# Patient Record
Sex: Male | Born: 1955 | Race: White | Hispanic: No | Marital: Married | Smoking: Current every day smoker
Health system: Southern US, Community
[De-identification: ages and names within clinical notes are randomized; demographics above are authoritative.]

## PROBLEM LIST (undated history)

## (undated) DIAGNOSIS — K219 Gastro-esophageal reflux disease without esophagitis: Secondary | ICD-10-CM

## (undated) DIAGNOSIS — M1990 Unspecified osteoarthritis, unspecified site: Secondary | ICD-10-CM

## (undated) DIAGNOSIS — I10 Essential (primary) hypertension: Secondary | ICD-10-CM

## (undated) DIAGNOSIS — N400 Enlarged prostate without lower urinary tract symptoms: Secondary | ICD-10-CM

## (undated) HISTORY — PX: CATARACT EXTRACTION, BILATERAL: SHX1313

## (undated) HISTORY — DX: Essential (primary) hypertension: I10

---

## 2010-01-20 ENCOUNTER — Inpatient Hospital Stay
Admission: EM | Admit: 2010-01-20 | Disposition: A | Discharge status: Home / Self Care | Payer: Self-pay | Source: Emergency Department | Admitting: Surgery

## 2010-02-04 ENCOUNTER — Ambulatory Visit (INDEPENDENT_AMBULATORY_CARE_PROVIDER_SITE_OTHER)
Admit: 2010-02-04 | Disposition: A | Discharge status: Home / Self Care | Payer: Self-pay | Source: Ambulatory Visit | Admitting: Surgical Critical Care

## 2010-03-18 ENCOUNTER — Ambulatory Visit (INDEPENDENT_AMBULATORY_CARE_PROVIDER_SITE_OTHER): Admit: 2010-03-18 | Disposition: A | Discharge status: Home / Self Care | Payer: Self-pay | Source: Ambulatory Visit

## 2010-05-13 ENCOUNTER — Emergency Department: Admit: 2010-05-13 | Payer: Self-pay | Source: Emergency Department | Admitting: Emergency Medicine

## 2011-04-11 NOTE — H&P (Unsigned)
Account Number: [id number]    *** PRELIMINARY DRAFT ***      Document ID: [id number]      Admit Date: 01/20/2010            Patient Location: [facility]11-01      Patient Type: I            ATTENDING PHYSICIAN: Martha Clan, MD                  HISTORY OF PRESENT ILLNESS:      The patient is a 55 year old man who reportedly fell approximately 20 feet      off of a ladder yesterday at 11:00 a.m.  He was later seen at [facility] where he was evaluated and noted to have multiple      right-sided rib fractures as well as a transverse process fractures of      right L1 and L2 and was recommended to transfer to [facility]      for further evaluation.  The patient initially refused and left [facility] AMA.  He now presents to [facility] for      further evaluation and definitive care.  Patient is currently complaining      of right posterior thoracic pain.  He is otherwise awake and alert.  He      reports a loss of consciousness at the scene.            PAST MEDICAL HISTORY:      None.            PAST SURGICAL HISTORY:      None.            MEDICATIONS:      None.            SOCIAL HISTORY:      A 50-pack-year smoking history.  He drinks 2 to 3 alcoholic beverages daily      and more on the weekends.            ALLERGIES:      He has no known drug allergies.            SOCIAL HISTORY:      Lives with his wife.  He works cutting trees.            REVIEW OF SYSTEMS:      A complete review of systems was performed including constitutional, skin,      neurologic, eye, ear, nose, throat, cardiovascular, pulmonary,      gastrointestinal, genitourinary, musculoskeletal, and skin.  These were all      negative except as noted above.            FAMILY HISTORY:      Reviewed and noncontributory.            PHYSICAL EXAMINATION:      VITAL SIGNS:  Blood pressure is 132/63, heart rate is 85, temperature is      96.2, oxygen saturations are 96% on room air.      GENERAL:  He  is well developed, well nourished, in no acute distress.      HEENT:  Normocephalic, atraumatic.  Pupils are equal, round, reactive to      light.  TMs are clear bilaterally.  Nares and oropharynx are normal with      moist mucous membranes.      NECK:  He  has no cervical spine tenderness or stepoffs.      CHEST:  Lungs are clear to auscultation bilaterally with symmetric rise and      fall of the chest.  He has tenderness to palpation over the right posterior      thorax with no crepitus.      CARDIOVASCULAR:  Regular rate and rhythm with 2+ radial and dorsalis pedis      pulses bilaterally.      ABDOMEN:  Soft, nontender, nondistended.  Pelvis is stable to compression.      BACK:  Without thoracic or lumbar spine tenderness.      RECTAL:  Shows normal tone, no gross blood, and a normal prostate.      GENITALIA:  Normal male genitalia with no blood at the urethral meatus.      EXTREMITIES:  He has full active range of motion bilateral upper and lower      extremities with no gross deformities of bilateral upper and lower      extremities.      NEUROLOGIC:  GCS is 15 with no focal deficits.            RADIOGRAPHIC EVALUATION:      A chest x-ray, CT of the head, CT of the cervical spine, CT of the chest,      abdomen and pelvis from [facility], the images were reviewed      on disk.  The findings are mildly displaced transverse process fractures on      the right of L1 and L2 as well as right posterior rib fractures of right      ribs 4, 5, 6, 7, and 11 with no pneumothorax.            IMPRESSION AND PLAN:      The patient has a concussion, 5 posterior rib fractures on the right, a      transverse process fracture on the right of L1 and L2.  Has a history of      alcohol abuse.  He will be admitted to the [facility] for aggressive pulmonary      toilet and analgesia as he is at a risk of significant respiratory      compromise with multiple right-sided rib fractures.  He will be placed on      the [facility] Withdrawal Assessment protocol.  He will receive a      regular diet.                        Electronic Signing Provider      _______________________________     Date/Time Signed: _____________      Martha Clan MD ([id number])            D:  01/20/2010 02:43 AM by Dr. Martha Clan, MD ([id number])      T:  01/20/2010 06:24 AM by [id number]          Everlean Cherry: [id number]) (Doc ID: [id number])                  cc:

## 2011-04-11 NOTE — Progress Notes (Unsigned)
Account Number: [id number]    *** PRELIMINARY DRAFT ***      Document ID: [id number]      Admit Date: 02/04/2010      Service Date: 02/04/2010            Patient Location: DISCHARGED 02/05/2010      Patient Type: O            PHYSICIAN/PROVIDER: Particia Lather MD                  CHIEF COMPLAINT:      Follow up for rib fractures.            HISTORY OF PRESENT ILLNESS:      This is a 55 year old Caucasian male status post a fall off a ladder 20      feet above ground on July 30.  He was admitted as a transfer from [facility] on July 31 for multiple right rib fractures, L1-L2      transverse process fractures, and a concussion.  He did well with pain      control and was discharged the following day on August 1 with a lumbar      corset.  Today, the patient complains of pain in his right side and back.      The pain has been somewhat improved, but the Percocet appears not to be      working, and especially the patient complains of difficulty sleeping at      night secondary to pain.  Other than that, the patient has no respiratory      symptoms and is able to ambulate and climb stairs without shortness of      breath.  He is eating well and moving his bowels regularly.  He has not yet      been to see Dr. Jaynie Collins, his initial neurosurgical consultant.            PHYSICAL EXAMINATION:      VITAL SIGNS:  Weight 156 pounds, pulse 86, blood pressure 147/69.      GENERAL:  The patient is a middle-aged, thin male, alert, and well      appearing.      LUNGS:  Clear to auscultation bilaterally.      HEART:  Regular rate and rhythm.      CHEST:  Wall tender to palpation on the right lateral and posterior chest      wall.      ABDOMEN:  Soft, nontender, nondistended.      BACK:  Tender from the lower thoracic down to the mid lumbar region      extending to the right.            ASSESSMENT AND PLAN:      A 55 year old male status post fall with multiple right rib fractures and      L1-L2 transverse process fractures.   Overall, he is improved and doing well      from a respiratory standpoint.  He reports he was actually taking Vicodin      instead of Percocet, even though initially he was discharged with Percocet.       He was given a prescription for Vicodin #40.  Additionally, he was      instructed to take 800 mg of ibuprofen t.i.d. with meals until his pain is      improved.  For his insomnia,  he was given a short course of  Flexeril as      needed, also p.r.n. muscle spasm.  The patient was advised that if he      continues to have pain that is not improved in his chest wall, he may      return to the trauma clinic, otherwise, he is discharged.  For his back, he      was advised to see Dr. Jaynie Collins if his pain persists.            This patient was discussed with Dr. Estil Daft who agrees with the above.                        Electronic Signing Provider      _______________________________     Date/Time Signed: _____________      Particia Lather MD ([id number])            D:  02/04/2010 15:53 PM by Dr. Lowanda Foster, MD ([id number])      T:  02/05/2010 10:19 AM by [id number]          Everlean Cherry: [id number]) (Doc ID: [id number])                  cc:

## 2011-04-11 NOTE — Progress Notes (Signed)
Stanley Kim, Stanley Kim      MRN:          [id number]      Account:      [id number]      Document ID:  [id number]      Service Date: 03/18/2010            Admit Date: 03/18/2010            Patient Location: DISCHARGED 03/19/2010      Patient Type: O            PHYSICIAN/PROVIDER: Jed Limerick MD                  DATE OF BIRTH:      11-25-1955.            HISTORY OF PRESENT ILLNESS:      This is a 55 year old man who fell approximately 20 feet off a ladder on      January 20, 2010.  The patient was seen the emergency room and admitted with      concussion and posterior rib fractures on the right and transverse process      fractures of L1 and L2.  He was previously seen on August 15 in our clinic      and was given a prescription for Vicodin at that time, 40 tablets and was      instructed to take 800 mg of ibuprofen t.i.d. with meals until his pain      improved.  The patient was given a short course of Flexeril also p.r.n. for      muscle spasms.  The patient was advised to see Dr. Jaynie Collins if his pain      persisted.  Upon arrival to the office today, this patient stated that Dr.      Duanne Moron office said they would not see him because he owed them money and due      to his financial difficulty he was unable to pay them and there is a      lawsuit ongoing.  I called Dr. Duanne Moron office and spoke with the billing      department and they stated that they could not see him because the      patient's wife called on September 16 to make an appointment and their      office policy is that there is a $250.00 fee for self-pay patients.  When      the patient's wife was told about this, she yelled at them and swore at      them and the patient said that they are going to sue the doctor's office.      Today, the patient presents to our office complaining of neck pain.  Upon his      fall, the patient did not complain of any neck pain and we had no indication      of any neck injury from his fall in July.  I did order a C spine x-ray  today      and that was negative for fracture, showing only degenerative changes.            PHYSICAL EXAMINATION:      VITAL SIGNS:  Temperature is 98.9, blood pressure is 156/82, pulse is 117,      weight is 175 pounds, respiratory rate is 16.      NEUROLOGIC:  The patient is alert and oriented x3.  GCS of 15.  HEART:  The patient's vital signs are stable.  His blood pressure of 156 is      slightly hypertensive.  His pulse is 117 which is tachycardic at this time.       The patient is asymptomatic in that regard.      LUNGS:  The patient is breathing well and independently.  Clear to      auscultation bilaterally.  No wheezes, rales, or rhonchi.                                   Page 1 of 2      Stanley Kim, Stanley Kim      MRN:          [id number]      Account:      [id number]      Document ID:  [id number]      Service Date: 03/18/2010            NECK:  Nontender to palpation with no stepoffs or deformities felt.            PLAN:      From a neurological standpoint, the patient should follow up with Dr. Duanne Moron      office.  We did contact Dr. Duanne Moron office after seeing the patient and did      tell them that he was going to be calling them for a followup appointment      regarding his pain.   We have recommended Aleve for pain, 440 mg b.i.d.      over-the-counter and following up with Dr. Duanne Moron office.  The patient has      been told that he could contact our office if there are any further issues      that we can assist with but at this time he has no need to follow up with      trauma specifically unless he has ongoing issues.            This patient was seen with Dr. Mauricio Po.            If there are questions you can contact Payton Spark, physician assistant      with the trauma service, extension [phone].                        Electronic Signing Provider            D:  03/18/2010 16:37 PM by Ms. Cecilio Asper, PA ([id number])      T:  03/19/2010 16:44 PM by [id number]                  cc:                                    Page 2 of 2      Authenticated and Edited by Cecilio Asper, PA On 03/22/10 11:08:25 AM      Authenticated by Jed Limerick [phone]) On 05/02/2010 10:55:01 AM

## 2011-06-24 HISTORY — PX: CATARACT EXTRACTION W/ INTRAOCULAR LENS  IMPLANT, BILATERAL: SHX1307

## 2013-03-21 ENCOUNTER — Encounter (INDEPENDENT_AMBULATORY_CARE_PROVIDER_SITE_OTHER): Payer: Self-pay

## 2017-05-26 ENCOUNTER — Other Ambulatory Visit: Payer: Self-pay

## 2017-05-26 ENCOUNTER — Inpatient Hospital Stay
Admission: EM | Admit: 2017-05-26 | Discharge: 2017-05-30 | DRG: 964 | Disposition: A | Discharge status: Home Health | Payer: Medicare Other | Attending: Surgery | Admitting: Surgery

## 2017-05-26 ENCOUNTER — Inpatient Hospital Stay: Payer: Medicare Other

## 2017-05-26 DIAGNOSIS — G8911 Acute pain due to trauma: Secondary | ICD-10-CM | POA: Diagnosis present

## 2017-05-26 DIAGNOSIS — S32484A Nondisplaced dome fracture of right acetabulum, initial encounter for closed fracture: Secondary | ICD-10-CM | POA: Diagnosis present

## 2017-05-26 DIAGNOSIS — R0902 Hypoxemia: Secondary | ICD-10-CM | POA: Diagnosis present

## 2017-05-26 DIAGNOSIS — I10 Essential (primary) hypertension: Secondary | ICD-10-CM | POA: Diagnosis present

## 2017-05-26 DIAGNOSIS — S32391A Other fracture of right ilium, initial encounter for closed fracture: Secondary | ICD-10-CM | POA: Diagnosis present

## 2017-05-26 DIAGNOSIS — M5414 Radiculopathy, thoracic region: Secondary | ICD-10-CM | POA: Diagnosis present

## 2017-05-26 DIAGNOSIS — M25562 Pain in left knee: Secondary | ICD-10-CM | POA: Diagnosis present

## 2017-05-26 DIAGNOSIS — M25561 Pain in right knee: Secondary | ICD-10-CM | POA: Diagnosis present

## 2017-05-26 DIAGNOSIS — W109XXA Fall (on) (from) unspecified stairs and steps, initial encounter: Secondary | ICD-10-CM | POA: Diagnosis present

## 2017-05-26 DIAGNOSIS — S271XXA Traumatic hemothorax, initial encounter: Secondary | ICD-10-CM | POA: Diagnosis present

## 2017-05-26 DIAGNOSIS — J90 Pleural effusion, not elsewhere classified: Secondary | ICD-10-CM | POA: Diagnosis present

## 2017-05-26 DIAGNOSIS — S42021A Displaced fracture of shaft of right clavicle, initial encounter for closed fracture: Secondary | ICD-10-CM | POA: Diagnosis present

## 2017-05-26 DIAGNOSIS — J984 Other disorders of lung: Secondary | ICD-10-CM | POA: Diagnosis not present

## 2017-05-26 DIAGNOSIS — S32301A Unspecified fracture of right ilium, initial encounter for closed fracture: Secondary | ICD-10-CM

## 2017-05-26 DIAGNOSIS — S2241XA Multiple fractures of ribs, right side, initial encounter for closed fracture: Principal | ICD-10-CM | POA: Diagnosis present

## 2017-05-26 DIAGNOSIS — F17210 Nicotine dependence, cigarettes, uncomplicated: Secondary | ICD-10-CM | POA: Diagnosis present

## 2017-05-26 DIAGNOSIS — S42001A Fracture of unspecified part of right clavicle, initial encounter for closed fracture: Secondary | ICD-10-CM

## 2017-05-26 HISTORY — DX: Acute pain due to trauma: G89.11

## 2017-05-26 LAB — BASIC METABOLIC PANEL
BUN: 9 mg/dL (ref 9.0–28.0)
CO2: 24 mEq/L (ref 21–29)
Calcium: 8.3 mg/dL — ABNORMAL LOW (ref 8.5–10.5)
Chloride: 97 mEq/L — ABNORMAL LOW (ref 100–111)
Creatinine: 0.7 mg/dL (ref 0.5–1.5)
Glucose: 133 mg/dL — ABNORMAL HIGH (ref 70–100)
Potassium: 4.3 mEq/L (ref 3.5–5.1)
Sodium: 128 mEq/L — ABNORMAL LOW (ref 136–145)

## 2017-05-26 LAB — HEMOLYSIS INDEX: Hemolysis Index: 23 — ABNORMAL HIGH (ref 0–18)

## 2017-05-26 LAB — GFR: EGFR: >60

## 2017-05-26 MED ORDER — SODIUM CHLORIDE 0.9 % IV SOLN
INTRAVENOUS | Status: DC
Start: 2017-05-26 — End: 2017-05-26

## 2017-05-26 MED ORDER — LIDOCAINE 5 % EX PTCH
1.0000 | MEDICATED_PATCH | CUTANEOUS | Status: DC
Start: 2017-05-26 — End: 2017-05-30
  Administered 2017-05-26 – 2017-05-30 (×5): 1 via TRANSDERMAL
  Filled 2017-05-26 (×5): qty 1

## 2017-05-26 MED ORDER — SODIUM CHLORIDE 0.9 % IV SOLN
INTRAVENOUS | Status: DC
Start: 2017-05-26 — End: 2017-05-27

## 2017-05-26 MED ORDER — ENOXAPARIN SODIUM 30 MG/0.3ML SC SOLN
30.0000 mg | Freq: Once | SUBCUTANEOUS | Status: AC
Start: 2017-05-26 — End: 2017-05-26
  Administered 2017-05-26: 16:00:00 30 mg via SUBCUTANEOUS
  Filled 2017-05-26: qty 0.3

## 2017-05-26 MED ORDER — SENNOSIDES-DOCUSATE SODIUM 8.6-50 MG PO TABS
1.0000 | ORAL_TABLET | Freq: Every evening | ORAL | Status: DC
Start: 2017-05-26 — End: 2017-05-30
  Administered 2017-05-26 – 2017-05-29 (×4): 1 via ORAL
  Filled 2017-05-26 (×4): qty 1

## 2017-05-26 MED ORDER — AMLODIPINE BESYLATE 5 MG PO TABS
5.0000 mg | ORAL_TABLET | Freq: Every day | ORAL | Status: DC
Start: 2017-05-26 — End: 2017-05-30
  Administered 2017-05-26 – 2017-05-30 (×5): 5 mg via ORAL
  Filled 2017-05-26 (×5): qty 1

## 2017-05-26 MED ORDER — NALOXONE HCL 0.4 MG/ML IJ SOLN (WRAP)
0.2000 mg | INTRAMUSCULAR | Status: DC | PRN
Start: 2017-05-26 — End: 2017-05-30

## 2017-05-26 MED ORDER — CYCLOBENZAPRINE HCL 10 MG PO TABS
5.0000 mg | ORAL_TABLET | Freq: Three times a day (TID) | ORAL | Status: DC | PRN
Start: 2017-05-26 — End: 2017-05-30
  Administered 2017-05-26 – 2017-05-29 (×6): 5 mg via ORAL
  Filled 2017-05-26 (×6): qty 1

## 2017-05-26 MED ORDER — MORPHINE SULFATE 4 MG/ML IJ/IV SOLN (WRAP)
4.0000 mg | Freq: Once | Status: AC
Start: 2017-05-26 — End: 2017-05-26
  Administered 2017-05-26: 4 mg via INTRAVENOUS
  Filled 2017-05-26: qty 1

## 2017-05-26 MED ORDER — GABAPENTIN 100 MG PO CAPS
100.0000 mg | ORAL_CAPSULE | Freq: Three times a day (TID) | ORAL | Status: DC
Start: 2017-05-26 — End: 2017-05-27
  Administered 2017-05-26 (×3): 100 mg via ORAL
  Filled 2017-05-26 (×3): qty 1

## 2017-05-26 MED ORDER — ENOXAPARIN SODIUM 30 MG/0.3ML SC SOLN
30.0000 mg | Freq: Two times a day (BID) | SUBCUTANEOUS | Status: DC
Start: 2017-05-26 — End: 2017-05-27
  Administered 2017-05-27: 30 mg via SUBCUTANEOUS
  Filled 2017-05-26: qty 0.3

## 2017-05-26 MED ORDER — ACETAMINOPHEN 500 MG PO TABS
1000.0000 mg | ORAL_TABLET | Freq: Three times a day (TID) | ORAL | Status: DC
Start: 2017-05-26 — End: 2017-05-30
  Administered 2017-05-26 – 2017-05-30 (×14): 1000 mg via ORAL
  Filled 2017-05-26 (×14): qty 2

## 2017-05-26 MED ORDER — OXYCODONE HCL 5 MG PO TABS
5.0000 mg | ORAL_TABLET | ORAL | Status: DC | PRN
Start: 2017-05-26 — End: 2017-05-30
  Administered 2017-05-26 – 2017-05-29 (×8): 5 mg via ORAL
  Filled 2017-05-26 (×9): qty 1

## 2017-05-26 NOTE — H&P (Signed)
TRAUMA HISTORY AND PHYSICAL     Date Time: 05/26/17 5:46 AM  Patient Name: Stanley Kim  Attending Physician: Martha Clan, MD  Primary Care Physician: Alinda Dooms, MD    Date of Admission:   05/26/2017  3:55 AM    Trauma Level:   Consult    Assessment/Plan:   The patient has the following active problems:  Patient Active Problem List   Diagnosis   . Closed fracture of six ribs of right side   . Closed nondisplaced dome fracture of right acetabulum   . Acute pain due to trauma   . Closed displaced fracture of shaft of right clavicle     61yoM s/p mechanical fall down stairs with R clavicle fx, R 1, 4-8 acute rib fx (+additional rib fx in various stages of healing), R acetabular fx.    Plan by systems:  Neuro: Pain control with tylenol, gabapentin, flexeril, oxy prn  Pulm: pulm toilet, IS  CV: cont home amlodipine, hold losartan inpatient  Endo: NAI  GI: bowel regimen  Heme/ID: [id number] BID DVT prophy  Renal: NAI  Neuromuscular: ortho consult for clavicle/acetabular fx  Psych: NAI  Wounds: NAI    Patient will be admitted to: [facility]  Massive transfusion protocol:  No      Consulting Services:   Orthopedics - [place]    Patient Complaint:   Stanley Kim is a 61 y.o. male who presents to the hospital after Fall: Fall from # of stairs: several.      Scene Report:      Scene GCS: Eye opening 4 - spontaneous, Verbal Response 5 - alert/oriented, Motor Response 6 - obeys commands. Total GCS: 15   Transport: ALS, Time of Injury 5 hours ago   Transferred from: [facility]   LOC: No     Intubated: No   Hemodynamically: Stable   C-spine immobilized pre-hospital: No          The medications, past medical/surgical history, family history, allergies & full review of systems were:  Reviewed    Allergies:   No Known Allergies    Medication:     Prescriptions Prior to Admission   Medication Sig   . amLODIPine (NORVASC) 5 MG tablet Take 5 mg by mouth daily.   Marland Kitchen losartan (COZAAR) 50 MG tablet  Take 50 mg by mouth daily.       Past Medical History:     Past Medical History:   Diagnosis Date   . Hypertension        Past Surgical History:     Past Surgical History:   Procedure Laterality Date   . CATARACT EXTRACTION, BILATERAL         Family History:   Cancer- father    Social History:     Social History     Social History   . Marital status: Married     Spouse name: N/A   . Number of children: N/A   . Years of education: N/A     Social History Main Topics   . Smoking status: Current Every Day Smoker   . Smokeless tobacco: Current User   . Alcohol use Yes   . Drug use: No   . Sexual activity: Yes     Other Topics Concern   . Not on file     Social History Narrative   . No narrative on file       Vaccination:   Tetanus up to date: Unknown  Review of Systems:   Review of Systems   Constitutional: Negative for fever.   Respiratory: Negative for shortness of breath.    Cardiovascular: Positive for chest pain.   Gastrointestinal: Negative for abdominal pain.   Genitourinary: Negative for dysuria.   Musculoskeletal: Positive for joint pain.   Neurological: Negative for speech change, focal weakness, loss of consciousness and headaches.   All other systems reviewed and are negative.      Physical Exam:   Physical Exam   Constitutional: He is oriented to person, place, and time and well-developed, well-nourished, and in no distress.   HENT:   Head: Normocephalic and atraumatic.   Eyes: Pupils are equal, round, and reactive to light.   Neck: Normal range of motion.   No midline TTP   Cardiovascular: Normal rate and regular rhythm.    Pulmonary/Chest: Effort normal. No respiratory distress.   Ecchymosis over R clavicle   Abdominal: Soft. There is no tenderness.   Musculoskeletal: Normal range of motion.   Neurological: He is alert and oriented to person, place, and time. GCS score is 15.       Vitals:    05/26/17 0359   BP: 147/71   Pulse: 71   Resp: 22   Temp: 98.4 F (36.9 C)   SpO2: 97%   Temp:  [97.1 F (36.2  C)-98.4 F (36.9 C)] 97.5 F (36.4 C)  Heart Rate:  [71-75] 75  Resp Rate:  [18-22] 18  BP: (136-156)/(71-76) 139/76      Labs:     Results     ** No results found for the last 24 hours. **          Rads:   Radiological Procedure reviewed.      The following images were received from an outside facility and reviewed:CT Head, CT Spine, CT Chest, CT Abdomen & Pelvis, Thoracic Spine and Lumbar Spine    Attending Attestation:   I saw and examined the patient at 0450h. I was present for Dr Tilman Neat evaluation. My examination was done concurrently with the resident. I have personally reviewed the patient's history and events, along with vitals, labs, radiology images and additional findings found in detail within the note above. I have personally edited and added to the text of this note to reflect my exam and medical assessment and plan.  I concur with the above documented assessment and care plan which was developed with and reviewed by me, with additions and exceptions noted by me.     Requires inpatient admission for multiple rib fx; I anticipate> 2 midnight stay    Larwance Sachs, MD, [facility]  Acute Care Surgery

## 2017-05-26 NOTE — EDIE (Signed)
[name]?NOTIFICATION?05/26/2017 03:53?Kim, Stanley E?MRN: [id number]    This patient has registered at the [facility] Emergency Department   For more information visit: [email or web address]   Criteria met      5 ED Visits in 12 Months    Security Events  No recent Security Events currently on file    ED Care Guidelines  There are currently no ED Care Guidelines in [name] for this patient. Please check your facility's medical records system.    Care Providers  Cherise Fedder has no care providers on record at this time.   E.D. Visit Count (12 mo.)  Facility Visits   [facility] 4   [facility] 1   Total 5   Note: Visits indicate total known visits.      Recent Emergency Department Visit Summary  Admit Date Facility [facility] Type Major Type Diagnoses or Chief Complaint   May 26, 2017 [place] [facility] [place] [place] [facility]  Emergency      Fall, Trauma -> Tr fr [facility]      May 25, 2017 [facility] [place] [place] [facility]  Emergency     May 25, 2017 [facility] [place] [place] [facility]  Emergency      Closed Head Injury With LOC      Fall      May 25, 2017 [facility] [place] [place] [facility]  Emergency     May 25, 2017 [facility] [place]  Emergency  Emergency         Recent Inpatient Visit Summary  No recorded inpatient visits.       Prescription Monitoring Program  000??- Narcotic Use Score  000??- Sedative Use Score  000??- Stimulant Use Score  - All Scores range from 000-999 with 75% of the population scoring < 200 and on 1% scoring above 650  - The last digit of the narcotic, sedative, and stimulant score indicates the number of active prescriptions of that type  - Higher Use scores correlate with increased prescribers, pharmacies, mg equiv, and overlapping prescriptions   Concerning or unexpectedly high scores should prompt a review  of the PMP record; this does not constitute checking PMP for prescribing purposes.    The above information is provided for the sole purpose of patient treatment. Use of this information beyond the terms of Data Sharing Memorandum of Understanding and License Agreement is prohibited. In certain cases not all visits may be represented. Consult the aforementioned facilities for additional information.   ? 2018 [employer] - [place], [place] - [email or web address]

## 2017-05-26 NOTE — Plan of Care (Signed)
Pt home meds given to the family.

## 2017-05-26 NOTE — UM Notes (Signed)
05/26/17 0458  Admit to Inpatient (ADULT INPATIENT ADMIT PANEL ) Once         UTILIZATION REVIEW CONTACT: Name: Josie Bransyn Adami  Clinical Case Manager  - Utilization Review  [facility]  Address:  [address] [place], [place]  [address]  NPI:   [id number]  Tax ID:  [id number]  Phone: [phone]  Fax: [phone]    Please use fax number [phone] to provide authorization for hospital services or to request additional information.        PATIENT NAME: Stanley Kim,Stanley Kim   DOB: [birth year]   PMH:  has a past medical history of Hypertension.  PSH:  has a past surgical history that includes Cataract extraction, bilateral.     ADMISSION REVIEW   61 y.o. year old male.  Orthopaedic consultation has specifically been requested to address this patient's current musculoskeletal presentation. Patient is a 61 year old male who presents after falling down stairs while using crutches. He states he was walking out of his house on his way to dinner when he lost his balance. Noticed immediate pain to right hip and right shoulder. Pain is localized to those areas and do not radiate anywhere. He states he immediately stood up and walked after the fall. He walked into the ambulance which brought him into the hospital. He states he has been using crutches recently due to left knee pain, he has plans for total knee replacement in the near future.    VS: 98.4, 71, 22, 147/71, 97% O2 2L NC    Diagnostics: RIGHT CLAVICLE XR-1. Oblique fracture of the midshaft of the right clavicle.  2. Partially imaged right rib fractures.    Medications:   oxyCODONE (ROXICODONE) immediate release tablet 5 mg 5 mg, PO, Q4H PRN Given: 12/04 5366     Assessment:  Right Upper Extremity:   Inspection:  swelling and ecchymosis to right clavicle  Palpation:  Tenderness-mild  ROM:  within normal limits  Joint Stability: normal  Strength: normal  Skin: normal              Peripheral Vascular: radial pulse present  Reflexes: normal  Sensation:  normal  Lymph Nodes: None Palpable  Coordination: Normal    Plan:   F/u XRay pelvis and clavicle   Imported CT reviewed   Pending further imaging likely WBAT right lower extremity,   Walker PRN  WBAT right upper extremity  Likely non-operative management    Josie Comfort Iversen, RN, BSN  UR Case Manager   [facility]  T [phone]

## 2017-05-26 NOTE — Plan of Care (Signed)
Pt Na[facility] [address] surgery dept was notified ([id number]) and will call us back with order,

## 2017-05-26 NOTE — PT Eval Note (Signed)
[facility]   Physical Therapy Evaluation   Patient: Stanley Kim    MRN#: [id number]   Unit: [facility] TOWER 8  Bed: V409/W119.14    Discharge Recommendations:   Discharge Recommendation: SNF   DME Recommendation: DME Recommended for Discharge:  (tbd at rehab)    If SNF recommended discharge disposition is not available, patient will need hands on assist for mobility, hospital bed, w/c, transport into home and HHPT.        Assessment:   Stanley Kim is a 61 y.o. male admitted 05/26/2017 s/p fall sustaining R rib and clavicle fracture as well as R acetabular fracture. Pt has been using crutches at home d/t L knee pain and is awaiting knee replacement.  Denies multiple falls at home.    Pt's functional mobility is impacted by:  decreased activity tolerance, decreased balance, decreased bed mobility, gait impairment, pain, decreased strength and transfers, poor insight .  There are a few comorbidities or other factors that affect plan of care and require modification of task including: assistive device needed for mobility, sensory and chronic pain.  Standardized tests and exams incorporated into evaluation include AMPAC mobility, ROM , Strength, pulse ox and pain.  Pt demonstrates a evolving clinical presentation due to severe pain.  Pt would continue to benefit from PT to address these deficits and increase functional independence.        Patient left without needs and call bell within reach. RN notified of session outcome.   Therapy Diagnosis: gait impairment    Rehabilitation Potential: good    Treatment Activities: eval performed  Educated the patient to role of physical therapy, plan of care, goals of therapy and safety with mobility and ADLs, energy conservation techniques, pursed lip breathing.    Plan:   Treatment/Interventions: Gait training, Functional transfer training, LE strengthening/ROM, Bed mobility, Compensatory technique education    PT Frequency:  3-4x/wk    Risks/Benefits/POC Discussed with Pt/Family: With patient/family       Precautions and Contraindications:  Falls  WBAT R UE/LE  Chronic L knee pain as well as both hip pain    Consult received for Fredna Dow for PT Evaluation and Treatment.  Patient's medical condition is appropriate for Physical therapy intervention at this time.    Medical Diagnosis: Closed fracture of multiple ribs of right side, initial encounter [S22.41XA]  Closed displaced fracture of right clavicle, unspecified part of clavicle, initial encounter [S42.001A]      History of Present Illness:   Stanley Kim is a 61 y.o. male admitted on 05/26/2017 with " s/p mechanical fall down stairs with R clavicle fx, R 1, 4-8 acute rib fx (+additional rib fx in various stages of healing), R acetabular fx." per H&P        Past Medical/Surgical History:   HTN    X-Rays/Tests/Labs:  XR Hips BI min 5 vw with pelvis   IMPRESSION:    Mildly displaced right iliac fracture, similar to the CT  last night. Severe hip arthritis, worse on the left. No pelvic canal  narrowing.     XR Clavicle Right  IMPRESSION:         1. Oblique fracture of the midshaft of the right clavicle.  2. Partially imaged right rib fractures.       Social History:   Prior Level of Function:  Baseline Activity Level:amb w/ crutches at home and uses RW in the community, indep w/ care  DME Currently at  Home:crutches, RW    Home Arrangement:  Living Arrangement:spouse  Type of Home:2 level but has first floor set up  Home Living Notes/Comments: goes through the back door and there are no steps    Subjective:   Patient is agreeable to participation in the therapy session. Family and/or guardian are agreeable to patient's participation in the therapy session. Nursing clears patient for therapy.     Pt Goal: return home  " I can try with crutches. I'll do better with crutches"    Pain:  Pain Assessment: Numeric Scale  Pain Score:8  Pain Location:R hips, ribs, clavicle  Pain  Intervention:premedicated    Objective:   Patient is in bed with peripheral IV access in place.    Vital Signs:  At rest 95% RA  W/ activity 94%RA    Musculoskeletal Examination:  B LE ZOX:WRUEAVW IR on B hips d/t pain to obtain optimal positioning for sit<>Stand    R LE Strength:gross 4-/5  L LE Strength:gross 4-/5    Sensory Assessment:  Intact    Functional Mobility:  Bed Mobility   Rolling:mod  Cues for seqeunce  Supine to Sit:mod of 2 cues for sequence  Sit to Supine:mod of 2  Sit to Stand:mod/max of 1 cues for LE positioning as pt has his legs are crossed d/t position of comfort  Stand to Sit: mod/max of 1    Ambulation:  PMP - Progressive Mobility Protocol   PMP Activity: Step 4 - Dangle at Bedside   Distance:1 step  Assist:mod of 2  Assistive Device:RW  Pattern:dec WB, heavy lean on RW, couldn't barely take a step d/t severe pain, requested to sit back down    Balance:  Standing Static:fair-  Standing Dynamic:fair-/poor    Participation:good  Endurance:fair    AM-PACT Inpatient Short Forms  Inpatient AM-PACT Performed? (PT): Basic Mobility Inpatient Short Form  AM-PACT "6 Clicks" Basic Mobility Inpatient Short Form  Turning Over in Bed: A lot  Sitting Down On/Standing From Armchair: A lot  Lying on Back to Sitting on Side of Bed: A lot  Assist Moving to/from Bed to Chair: A lot  Assist to Walk in Hospital Room: Total  Assist to Climb 3-5 Steps with Railing: Total  PT Basic Mobility Raw Score: 10  CMS 0-100% Score: 76.75%      Patient left with call bell within reach, all needs met,  SCD: off  Fall mat: in place  Bed alarm: on  Chair alarm: n/a  and all questions answered. RN notified of session outcome and patient response.     Goals:   Goals  Goal Formulation: With patient/family  Time for Goal Acheivement: 5 visits  Goals: Select goal  Pt Will Go Supine To Sit: with contact guard assist  Pt Will Perform Sit to Stand: with contact guard assist  Pt Will Transfer Bed/Chair: with rolling walker, with contact  guard assist  Pt Will Ambulate: 31-50 feet, with rolling walker, with contact guard assist     Matilde Sprang, PT   Pager ID# [phone] 05/26/2017, 4:56 PM    Time of treatment:   PT Received On: 05/26/17  Start Time: 1630  Stop Time: 1700  Time Calculation (min): 30 min

## 2017-05-26 NOTE — ED Notes (Signed)
Bed: S 16  Expected date:   Expected time:   Means of arrival:   Comments:  Jazion, [place]

## 2017-05-26 NOTE — Plan of Care (Deleted)
Problem: Pain  Goal: Pain at adequate level as identified by patient  Outcome: Progressing   05/26/17 2100   Goal/Interventions addressed this shift   Pain at adequate level as identified by patient Identify patient comfort function goal   Pt A&O X4. VSS.  Tolerated whole pills with thin liquids.  Pt unable to ambulate because of the pain,as well as reposition self in bed with some  assistance.   Encouraged TCDB and IS use.  Pt is expectorated copious amt of clear sputum.  Pt sounded very congested.    Continent of bowel and bladder. LBM 05/25/17.  Pt voided in urinal.  Good pedal pulses bilaterally,cap refill <3 sec in all 4 extremities.  Call bell and bedside table within reach at all times. Will continue to monitor patient closely.   Oxycodone 5mg  po was able to bring pain down to 5/10.  Will try to ATC oxycodone.    Cherly Beach  05/26/17  9:00 PM

## 2017-05-26 NOTE — Plan of Care (Signed)
Problem: Moderate/High Fall Risk Score >5  Goal: Patient will remain free of falls  Outcome: Progressing   05/26/17 0621   OTHER   High (Greater than 13) HIGH-Initiate use of floor mats as appropriate   Pt A&O X4.  BP is slightly elevated.  Pt said he had not taken his BP med today yet but he took it yesterday.  Tolerated whole pills with thin liquids.  Pt wanted to ambulate to BR with walker but there is no order for his activity restriction.  He can reposition self in bed with minimal assistance.   Encouraged TCDB and IS use.   Continent of  bladder. Pt voiding in urinal in ED.   Pain Controlled with morphine in ED.    Good pedal pulses bilaterally,cap refill <3 sec in all 4 extremities.   Call bell and bedside table within reach at all times. Will continue to monitor patient closely.     Cherly Beach  05/26/17  6:23 AM

## 2017-05-26 NOTE — Consults (Signed)
Orthopaedic Consult    Date Time: 05/26/17 4:34 PM  Patient Name: Stanley Kim, Stanley Kim ;   Lonia Blood* Attending Physician    Time first seen by orthopaedics: 8am        Assessment & Plan  Orthopaedic assessment:  61 y/o M with R pelvic fracture and R clavicular fracture    Reductions/Procedures/Splinting performed (indicate type of Anesthesia used):  none    Plan:    F/u XRay pelvis and clavicle   Imported CT reviewed   Pending further imaging likely WBAT right lower extremity,   Walker PRN  nwb right upper extremity  Likely non-operative management    Arlie Solomons, PGY-2  Dept of Orthopaedics   Personal Pager [phone]  Ortho on-call Pager [phone]    Attending Addendum/Attestation:  I have personally seen and examined this patient and have participated in their care. I agree with the clinical information, including the physical exam, radiological/x-ray interpretation. patient history, review of systems and plan as documented above. In addition, I have edited this note to reflect my findings and plan as well as to incorporate any new data.    Non op right clavicle anf right iliac wing pelvis fracture  WBAT R LE  NWB R UE    Lucky Rathke MD           HPI    Stanley Kim is a 61 y.o. year old male.  Orthopaedic consultation has specifically been requested to address this patient's current musculoskeletal presentation. Patient is a 61 year old male who presents after falling down stairs while using crutches. He states he was walking out of his house on his way to dinner when he lost his balance. Noticed immediate pain to right hip and right shoulder. Pain is localized to those areas and do not radiate anywhere. He states he immediately stood up and walked after the fall. He walked into the ambulance which brought him into the hospital. He states he has been using crutches recently due to left knee pain, he has plans for total knee replacement in the near future. Denies any loss of  consciousness during the fall. Denies any nausea, fever, vomiting, chest pain or shortness of breath.    Past Medical and Surgical History      Past Medical History:   Diagnosis Date   . Acute pain due to trauma 05/26/2017   . Hypertension         Past Surgical History:   Procedure Laterality Date   . CATARACT EXTRACTION, BILATERAL         Past Social History & Family History   Social History:   Social History     Social History   . Marital status: Married     Spouse name: N/A   . Number of children: N/A   . Years of education: N/A     Social History Main Topics   . Smoking status: Current Every Day Smoker   . Smokeless tobacco: Current User   . Alcohol use Yes   . Drug use: No   . Sexual activity: Yes     Other Topics Concern   . Not on file     Social History Narrative   . No narrative on file       Family History: No family history on file.    Relevant Family & Social  history reviewed.  Any findings relevant to current orthopaedic presentation noted in HPI, otherwise the remainder are noncontributory.  Review of Systems:   MSK noted as above.   GI: No current Nausea/vomiting  ENT: Denies sore throat, epistaxis  CV: Denies chest pain   Resp: No current shortness of breath    Other than mentioned above, there are no Constitutional, Neurological, Psychiatric, ENT, Ophthalmological, Cardiovascular, Respiratory, GI, GU, Musculoskeletal, Integumentary, Lymphatic, Endocrine or Allergic issues.            Home Medications     Prior to Admission medications    Medication Sig Start Date End Date Taking? Authorizing Provider   amLODIPine (NORVASC) 5 MG tablet Take 5 mg by mouth daily.   Yes [provider]   losartan (COZAAR) 50 MG tablet Take 50 mg by mouth daily.   Yes [provider]       Allergies   No Known Allergies    Radiology Studies: (actual Orthopaedically relevant films reviewed and read by Orthopaedics)   R pelvic fracture  R clavicle fracture                           Physical Exam:      Patient is a 61 y.o. year old male who is alert, well appearing, and in no distress and oriented to person, place, and time, mood is alert  Orientation: Fully Oriented    BP 150/75   Pulse 84   Temp 98.4 F (36.9 C) (Oral)   Resp 18   Ht 1.702 m (5\' 7" )   Wt 81.6 kg (180 lb)   SpO2 95%   BMI 28.19 kg/m   81.6 kg (180 lb)   1.702 m (5\' 7" )    Gait: not assessed     Heart: RRR  Lungs: CTAB      Right Upper Extremity:   Inspection:  swelling and ecchymosis to right clavicle  Palpation:  Tenderness-mild  ROM:  within normal limits  Joint Stability: normal  Strength: normal  Skin: normal   Peripheral Vascular: radial pulse present  Reflexes: normal  Sensation: normal  Lymph Nodes: None Palpable  Coordination: Normal      Right Lower Extremity:     Inspection:  No swelling, erythema, deformity, atrophy or hypertrophy noted  Palpation:  Tenderness-none  ROM:  within normal limits  Joint Stability: normal  Strength: normal  Skin: normal   Peripheral Vascular: normal  Reflexes: normal  Sensation: normal  Lymph Nodes: None Palpable  Coordination: Normal     Left Upper Extremity:   Inspection:  No swelling, erythema, deformity, atrophy or hypertrophy noted  Palpation:  Tenderness-none  ROM:  within normal limits  Joint Stability: normal  Strength: normal  Skin: normal   Peripheral Vascular: normal  Reflexes: normal  Sensation: normal  Lymph Nodes: None Palpable  Coordination: Normal    Left Lower Extremity:   Inspection:  No swelling, erythema, deformity, atrophy or hypertrophy noted  Palpation:  Tenderness-none  ROM:  within normal limits  Joint Stability: normal  Strength: normal  Skin: normal   Peripheral Vascular: normal  Reflexes: normal  Sensation: normal  Lymph Nodes: None Palpable  Coordination: Normal     Pelvis:   Skin: normal  Palpation: Tenderness- none  Stability: normal             The review of the patient's medications does not in any way constitute an endorsement, by this clinician,  of their use,  dosage, indications, route, efficacy, interactions, or other clinical parameters.    This note  was generated within the EPIC EMR using Dragon medical speech recognition software and may contain inherent errors or omissions not intended by the user. Grammatical and punctuation errors, random word insertions, deletions, pronoun errors and incomplete sentences are occasional consequences of this technology due to software limitations. Not all errors are caught or corrected.  Although every attempt is made to root out erroneus and incomplete transcription, the note may still not fully represent the intent or opinion of the author. If there are questions or concerns about the content of this note or information contained within the body of this dictation they should be addressed directly with the author for clarification.

## 2017-05-26 NOTE — ED Provider Notes (Signed)
[facility] EMERGENCY DEPARTMENT H&P      Visit date: 05/26/2017      CLINICAL SUMMARY          Diagnosis:    .     Final diagnoses:   Closed fracture of multiple ribs of right side, initial encounter   Closed displaced fracture of right clavicle, unspecified part of clavicle, initial encounter         MDM Notes:      Admit to trauma for treatment.         Disposition:         Inpatient Admit      ED Disposition     ED Disposition Condition Date/Time Comment    Admit  Tue May 26, 2017  4:58 AM Admitting Physician: Stanley Kim [phone]   Diagnosis: Multiple rib fractures [478295]   Estimated Length of Stay: > or = to 2 midnights   Tentative Discharge Plan?: Home or Self Care [1]   Patient Class: Inpatient [101]                         CLINICAL INFORMATION        HPI:      Chief Complaint: Fall  .    Stanley Kim is a 61 y.o. male transfer from [place], with hx of HTN, who presents with R acetabular fracture, R clavicular fracture, and R rib fractures (1, 4-8) s/p fall tonight on crutches tonight. CT head at [facility] negative. No blood thinners.    History obtained from: Patient          ROS:      Positive and negative ROS elements as per HPI.  All other systems reviewed and negative.      Physical Exam:      Pulse 71  BP 147/71  Resp 22  SpO2 97 %  Temp 98.4 F (36.9 C)      Constitutional: Vital signs reviewed. Well appearing.  Head: Normocephalic, atraumatic  Eyes: No conjunctival injection. No discharge. PERRL, EOMI  ENT: Mucous membranes moist, clear oropharynx  Neck: Normal range of motion. Trachea midline. No midline tenderness.  Respiratory/Chest: clear to auscultation. No wheezes, rales or rhonchi. Good air movement.  No dyspnea or work of breathing. +ecchymosis over R clavicle  Cardiovascular: Regular rate and rhythm. No murmur. No rubs, murmurs, gallops  Abdomen: soft and nontender in all quadrants. No guarding or rebound. No masses or hepatosplenomegaly  Upper  Extremities:No edema. No cyanosis. No deformity.  Lower Extremities: No edema. No cyanosis. No deformity.  Neurological: Alert Normal and symmetric motor tone by observation. Speech normal. Memory Normal. No facial assymetry.  Skin: Warm and dry. No rash.  Psychiatric: Normal affect. Normal concentration.                PAST HISTORY        Primary Care Provider: Alinda Dooms, Kim        PMH/PSH:    .     Past Medical History:   Diagnosis Date   . Hypertension        He has a past surgical history that includes Cataract extraction, bilateral.          Social/Family History:      He has no tobacco, alcohol, and drug history on file.    No family history on file.      Listed Medications on Arrival:    .  Home Medications     Med List Status:  Complete Set By: Stanley Moloney, RN at 05/26/2017  4:04 AM                amLODIPine (NORVASC) 5 MG tablet     Take 5 mg by mouth daily.     losartan (COZAAR) 50 MG tablet     Take 50 mg by mouth daily.         Allergies: He has No Known Allergies.            VISIT INFORMATION        Clinical Course in the ED:            Medications Given in the ED:    .     ED Medication Orders     Start Ordered     Status Ordering Provider    05/26/17 0430 05/26/17 0429  morphine injection 4 mg  Once     Route: Intravenous  Ordered Dose: 4 mg     Last MAR action:  Given Stanley Kim            Procedures:      Procedures      Interpretations:        Differential Diagnosis considered by Kim (not completely inclusive): rib fractures, pneumothorax, hemothorax, flail chest, pulmonary contusion    Rhythm Strip Interpretation / Cardiac Monitor Analysis: Yes: 74    Pulse Ox Analysis: Yes: saturation: 97 %; Oxygen use: room air; Interpretation: Normal     All tests, tracings, EKGs, vital signs and radiological studies above where applicable were interpreted by me, Stanley Nurse Kim.              RESULTS        Lab Results:      Results     ** No results found for the last 24 hours. **               Radiology Results:      No orders to display               Scribe Attestation:      I was acting as a Neurosurgeon for Stanley Kim on Stanley Kim  Treatment Team: Scribe: Stanley Kim     I am the first provider for this patient and I personally performed the services documented. Treatment Team: Scribe: Stanley Kim is scribing for me on Stanley Kim. This note and the patient instructions accurately reflect work and decisions made by me.  Stanley Kim    *This note was generated by the Epic EMR system/ Dragon speech recognition and may contain inherent errors or omissions not intended by the user. Grammatical errors, random word insertions, deletions, pronoun errors and incomplete sentences are occasional consequences of this technology due to software limitations. Not all errors are caught or corrected. If there are questions or concerns about the content of this note or information contained within the body of this dictation they should be addressed directly with the author for clarification.Stanley Kim  05/28/17 [phone]

## 2017-05-26 NOTE — Plan of Care (Signed)
Problem: Moderate/High Fall Risk Score >5  Goal: Patient will remain free of falls  Outcome: Progressing  Bed mobility at this time, safety precaution maintained and continue monitoring.    Problem: Safety  Goal: Patient will be free from injury during hospitalization  Outcome: Progressing  Pt oriented times 4 stable and has no distress, safety precaution maintained and continue monitoring.  Goal: Patient will be free from infection during hospitalization  Outcome: Progressing  Will monitor for signs of infection.    Problem: Pain  Goal: Pain at adequate level as identified by patient  Outcome: Progressing  Pain meds given as needed    Problem: Side Effects from Pain Analgesia  Goal: Patient will experience minimal side effects of analgesic therapy  Outcome: Progressing  Will monitor for side effect of pain meds    Problem: Discharge Barriers  Goal: Patient will be discharged home or other facility with appropriate resources  Outcome: Progressing      Problem: Psychosocial and Spiritual Needs  Goal: Demonstrates ability to cope with hospitalization/illness  Outcome: Progressing

## 2017-05-26 NOTE — OT Eval Note (Signed)
[facility]   Occupational Therapy Evaluation     Patient: Stanley Kim    MRN#: [id number]   Unit: [facility] TOWER 8  Bed: J191/Y782.95                                     Discharge Recommendations:   Discharge Recommendation: SNF   DME Recommended for Discharge:  (see alternative D/C rec)    If SNF recommended discharge disposition is not available, patient will need hands on assist for all ADLs and functional mobility, manual w/c, transport into home, and HHOT.    Assessment:   Stanley Kim is a 61 y.o. male admitted 05/26/2017 s/p fall down stairs resulting in R clavicle fx, R 1,4-8 rib fxs, and R acetabular fx. Pt is WBAT to both RUE and RLE. At baseline, pt able to complete ADLs with independence but requiring use of crutches for functional mobility. Pt with B hips internally rotated with difficulty completing external rotation even with physical assist. Pt unable to externally rotate hips enough to place feet below knees therefore pt attempting to stand with feet crossed. Pt requiring Mod A for sit to stand and only able to take 1 step forward with RW. Pt requiring Max A for LB dressing at this time - educated on use of AE to increase independence. Pt continues to benefit from OT services to maximize independence and safety with ADLs and functional mobility.      Expanded chart review completed including review of labs, review of imaging, review of vitals, review of H&P and physician progress notes and review of consulting physician notes .  Pt's ability to complete ADLs and functional transfers is impaired due to the following deficits:  decreased activity tolerance, decreased balance, decreased bed mobility, decreased strength, transfers  and weight bearing restrictions.  Pt demonstrates performance deficits with grooming, bathing, dressing, toileting and functional mobility. There are a few comorbidities or other factors that affect plan of care and require modification of task  including: assistive device needed for mobility, frequent falls, has stairs to manage and home alone for a portion of the day.  Pt would continue to benefit from OT to address these deficits and increase functional independence.      Impairments: Assessment: decreased strength;balance deficits;decreased independence with ADLs;decreased independence with IADLs;decreased endurance/activity tolerance    Therapy Diagnosis: Decreased independence with ADLs    Rehabilitation Potential: Prognosis: Good     Treatment Activities: Evaluation, ADL re-training, Functional mobility/trasnfer training, Pt education    Educated the patient to role of occupational therapy, plan of care, goals of therapy and safety with mobility and ADLs, weight bearing precautions.    Plan:   OT Frequency Recommended: 3-4x/wk     Treatment Interventions: ADL retraining;Functional transfer training;UE strengthening/ROM;Endurance training;Patient/Family training;Equipment eval/education;Neuro muscular reeducation;Compensatory technique education     Risks/benefits/POC discussed with patient         Precautions and Contraindications:   Precautions  Other Precautions: Fall risk; WBAT RUE and RLE      Consult received for Fredna Dow for OT Evaluation and Treatment.  Patient's medical condition is appropriate for Occupational Therapy intervention at this time.    Admitting Diagnosis: Closed fracture of multiple ribs of right side, initial encounter [S22.41XA]  Closed displaced fracture of right clavicle, unspecified part of clavicle, initial encounter [S42.001A]      History of Present Illness:  Stanley Kim is a 61 y.o. male admitted on 05/26/2017 with "s/p mechanical fall down stairs with R clavicle fx, R 1, 4-8 acute rib fx (+additional rib fx in various stages of healing), R acetabular fx." -Per H&P    Past Medical/Surgical History:  Past Medical History:   Diagnosis Date   . Acute pain due to trauma 05/26/2017   . Hypertension      Past Surgical  History:   Procedure Laterality Date   . CATARACT EXTRACTION, BILATERAL       Imaging/Tests/Labs:  Xr Clavicle Right    Result Date: 05/26/2017   1. Oblique fracture of the midshaft of the right clavicle. 2. Partially imaged right rib fractures. [name], MD 05/26/2017 10:40 AM    Xr Hips Bi Min 5 Vw With Pelvis    Result Date: 05/26/2017   Mildly displaced right iliac fracture, similar to the CT last night. Severe hip arthritis, worse on the left. No pelvic canal narrowing. [name], MD 05/26/2017 11:55 AM    Social History:   Prior Level of Function:  Prior level of function: Independent with ADLs, Ambulates with assistive device (Simultaneous filing. User may not have seen previous data.)  Assistive Device: Crutches, Front wheel walker  Baseline Activity Level: Community ambulation  Driving: does not drive  DME Currently at Home: [employer] walker, Crutches (shower chair)    Home Living Arrangements:  Living Arrangements: Spouse/significant other  Type of Home: House  Home Layout: Two level, Performs ADL's on one level (13 STE from from, no STE if goes in back)  Bathroom Shower/Tub: Medical sales representative: Standard  DME Currently at Home: [employer] walker, Crutches (shower chair)      Subjective:   Subjective: "I can do it with thr crutches" Patient is agreeable to participation in the therapy session. Nursing clears patient for therapy.     Goal:  To return home    Pain Assessment  Pain Assessment: Numeric Scale (0-10)  Pain Score: 8-severe pain  Pain Location:  (R hip, ribs, and clavicle)  Pain Intervention(s): Repositioned (Premedicated)      Objective:        Observation of Patient/Vital Signs:  Patient is in bed with SCD's and peripheral IV in place.    Cognitive Status and Neuro Exam:  Cognition/Neuro Status  Arousal/Alertness: Appropriate responses to stimuli  Attention Span: Appears intact  Orientation Level: Oriented X4  Memory: Appears intact  Following Commands: independent  Safety  Awareness: independent  Insights: Fully aware of deficits  Problem Solving: Able to problem solve independently              Musculoskeletal Examination  Gross ROM  Right Upper Extremity ROM:  (Grossly WFl - limited at shoulder 2/2 fx)  Left Upper Extremity ROM: within functional limits  Right Lower Extremity ROM:  (limited ER)  Left Lower Extremity ROM:  (limited ER)    Gross Strength  Right Upper Extremity Strength:  (Not formally assessed - [facility])  Left Upper Extremity Strength: within functional limits  Right Lower Extremity Strength:  (Not formally assessed - WFL)  Left Lower Extremity Strength: within functional limits              Sensory/Oculomotor Examination  Sensory  Auditory: intact  Tactile - Light Touch: intact  Visual Acuity: intact  Visual Perceptual Skills: intact         Activities of Daily Living  Self-care and Home Management  Eating: Independent  Grooming: Stand by Assist (seated)  Bathing: Moderate Assist  UB Dressing: Stand by Assist  LB Dressing: Maximal Assist  Toileting: Minimal Assist    Functional Mobility:  Mobility and Transfers  Supine to Sit: Moderate Assist  Sit to Supine: Moderate Assist  Sit to Stand: Moderate Assist;Maximal Assist (with RW)  Functional Mobility/Ambulation: Moderate Assist (with RW - only able to take one step)     PMP Activity: Step 6 - Walks in Room (only able to take 1 step)     Balance  Balance  Static Sitting Balance: good  Dyanamic Sitting Balance: fair  Static Standing Balance: fair  Dynamic Standing Balance: poor    Participation and Activity Tolerance  Participation and Endurance  Participation Effort: good  Endurance:  (fair )    Patient left with call bell within reach, all needs met, SCDs off, fall mat in place, bed alarm on, chair alarm N/A and all questions answered. RN notified of session outcome and patient response.       Goals:  Time For Goal Achievement: 5 visits  ADL Goals  Patient will groom self: Modified Independent, at sinkside  Patient will  dress lower body: Modified Independent, with AE  Pt will complete bathing: Modified Independent  Patient will toilet: Modified Independent  Mobility and Transfer Goals  Pt will perform functional transfers: Modified Independent, with rolling walker  Other Goal:  (Will complete bed mobility with Mod I)  Neuro Re-Ed Goals  Pt will perform dynamic standing balance: Supervision, for 10 minutes, to complete standing ADLs safely                      Rosina Lowenstein, MS, OTR/L  Pager # [phone]         Time of treatment:   OT Received On: 05/26/17  Start Time: 1624  Stop Time: 1655  Time Calculation (min): 31 min

## 2017-05-27 ENCOUNTER — Inpatient Hospital Stay: Payer: Medicare Other

## 2017-05-27 ENCOUNTER — Other Ambulatory Visit: Payer: Medicare Other

## 2017-05-27 DIAGNOSIS — R0902 Hypoxemia: Secondary | ICD-10-CM

## 2017-05-27 LAB — CBC AND DIFFERENTIAL
Absolute NRBC: 0 10*3/uL
Basophils Absolute Automated: 0.04 10*3/uL (ref 0.00–0.20)
Basophils Automated: 0.5 %
Eosinophils Absolute Automated: 0.12 10*3/uL (ref 0.00–0.70)
Eosinophils Automated: 1.5 %
Hematocrit: 36 % — ABNORMAL LOW (ref 42.0–52.0)
Hgb: 11.4 g/dL — ABNORMAL LOW (ref 13.0–17.0)
Immature Granulocytes Absolute: 0.03 10*3/uL
Immature Granulocytes: 0.4 %
Lymphocytes Absolute Automated: 0.5 10*3/uL (ref 0.50–4.40)
Lymphocytes Automated: 6.3 %
MCH: 32 pg (ref 28.0–32.0)
MCHC: 31.7 g/dL — ABNORMAL LOW (ref 32.0–36.0)
MCV: 101.1 fL — ABNORMAL HIGH (ref 80.0–100.0)
MPV: 9.6 fL (ref 9.4–12.3)
Monocytes Absolute Automated: 0.49 10*3/uL (ref 0.00–1.20)
Monocytes: 6.2 %
Neutrophils Absolute: 6.71 10*3/uL (ref 1.80–8.10)
Neutrophils: 85.1 %
Nucleated RBC: 0 /100 WBC (ref 0.0–1.0)
Platelets: 175 10*3/uL (ref 140–400)
RBC: 3.56 10*6/uL — ABNORMAL LOW (ref 4.70–6.00)
RDW: 14 % (ref 12–15)
WBC: 7.89 10*3/uL (ref 3.50–10.80)

## 2017-05-27 LAB — BASIC METABOLIC PANEL
BUN: 9 mg/dL (ref 9.0–28.0)
CO2: 28 mEq/L (ref 21–29)
Calcium: 8.5 mg/dL (ref 8.5–10.5)
Chloride: 101 mEq/L (ref 100–111)
Creatinine: 0.7 mg/dL (ref 0.5–1.5)
Glucose: 120 mg/dL — ABNORMAL HIGH (ref 70–100)
Potassium: 4.6 mEq/L (ref 3.5–5.1)
Sodium: 132 mEq/L — ABNORMAL LOW (ref 136–145)

## 2017-05-27 LAB — B-TYPE NATRIURETIC PEPTIDE: B-Natriuretic Peptide: 47 pg/mL (ref 0–100)

## 2017-05-27 LAB — GFR: EGFR: >60

## 2017-05-27 LAB — HEMOLYSIS INDEX: Hemolysis Index: 19 — ABNORMAL HIGH (ref 0–18)

## 2017-05-27 MED ORDER — GABAPENTIN 300 MG PO CAPS
300.00 mg | ORAL_CAPSULE | Freq: Three times a day (TID) | ORAL | Status: DC
Start: 2017-05-27 — End: 2017-05-30
  Administered 2017-05-27 – 2017-05-30 (×10): 300 mg via ORAL
  Filled 2017-05-27 (×10): qty 1

## 2017-05-27 MED ORDER — KETOROLAC TROMETHAMINE 30 MG/ML IJ SOLN
15.00 mg | Freq: Four times a day (QID) | INTRAMUSCULAR | Status: AC
Start: 2017-05-27 — End: 2017-05-27
  Administered 2017-05-27 (×4): 15 mg via INTRAVENOUS
  Filled 2017-05-27 (×4): qty 1

## 2017-05-27 MED ORDER — GABAPENTIN 100 MG PO CAPS
200.0000 mg | ORAL_CAPSULE | Freq: Three times a day (TID) | ORAL | Status: DC
Start: 2017-05-27 — End: 2017-05-27
  Administered 2017-05-27: 08:00:00 200 mg via ORAL
  Filled 2017-05-27: qty 2

## 2017-05-27 MED ORDER — LOSARTAN POTASSIUM 25 MG PO TABS
50.00 mg | ORAL_TABLET | Freq: Every day | ORAL | Status: DC
Start: 2017-05-27 — End: 2017-05-30
  Administered 2017-05-27 – 2017-05-30 (×4): 50 mg via ORAL
  Filled 2017-05-27 (×4): qty 2

## 2017-05-27 MED ORDER — ENOXAPARIN SODIUM 30 MG/0.3ML SC SOLN
30.00 mg | Freq: Once | SUBCUTANEOUS | Status: DC
Start: 2017-05-27 — End: 2017-05-27

## 2017-05-27 MED ORDER — OXYCODONE HCL 5 MG PO TABS
5.00 mg | ORAL_TABLET | Freq: Once | ORAL | Status: AC
Start: 2017-05-27 — End: 2017-05-27
  Administered 2017-05-27: 09:00:00 5 mg via ORAL
  Filled 2017-05-27: qty 1

## 2017-05-27 MED ORDER — ENOXAPARIN SODIUM 30 MG/0.3ML SC SOLN
30.00 mg | Freq: Once | SUBCUTANEOUS | Status: DC
Start: 2017-05-28 — End: 2017-05-28

## 2017-05-27 MED ORDER — ALBUTEROL-IPRATROPIUM 2.5-0.5 (3) MG/3ML IN SOLN
3.00 mL | Freq: Four times a day (QID) | RESPIRATORY_TRACT | Status: DC
Start: 2017-05-27 — End: 2017-05-28
  Administered 2017-05-27 – 2017-05-28 (×5): 3 mL via RESPIRATORY_TRACT
  Filled 2017-05-27 (×4): qty 3

## 2017-05-27 NOTE — Plan of Care (Signed)
Patient awaiting transfer to room 337 . Report called  to [facility] receiving nurse . Also patient to have a chest CT due today . Patient made aware of transfer order .

## 2017-05-27 NOTE — Plan of Care (Signed)
Problem: Moderate/High Fall Risk Score >5  Goal: Patient will remain free of falls  Outcome: Progressing  Safety maintained at all times with call bell within reach . All alarm settings in place . Patient to call for assistance at all times . Will continue with hourly rounding .     Problem: Safety  Goal: Patient will be free from injury during hospitalization  Outcome: Progressing  bedrest with pillow support in place . Hourly rounding throughout . Call bell within reach . All safety devices in place . Will monitor .    Problem: Pain  Goal: Pain at adequate level as identified by patient  Outcome: Progressing  Pain managed with current medications with some effect . Will monitor pain scale closely and report any changes .     Problem: Side Effects from Pain Analgesia  Goal: Patient will experience minimal side effects of analgesic therapy  Outcome: Progressing  No side effects reported from pain medications at the moment . Will continue to monitor closely .    Problem: Discharge Barriers  Goal: Patient will be discharged home or other facility with appropriate resources  Outcome: Progressing  Patient will transfers to [facility] when bed is ready .disposition to be discussed later .    Problem: Psychosocial and Spiritual Needs  Goal: Demonstrates ability to cope with hospitalization/illness  Outcome: Progressing  Pleasant and cooperative . On CIWA protocol at the moment . Will monitor .     Problem: Compromised Tissue integrity  Goal: Damaged tissue is healing and protected  Outcome: Progressing  Skin intact except some areas of bruising and abrasion noted . Patient also has tatoos over body areas .   Goal: Nutritional status is improving  Outcome: Progressing  Appetite poor . Patient refused breakfast stating that he is not hungry at the moment . Tolerating po fluids well .

## 2017-05-27 NOTE — Progress Notes (Signed)
Assessed patient during morning rounds; on 4LNC; spot oxygen saturation 90%; asked patient to cough/deep breathe; unable to productively cough due to pain.  Patient reports pain in right chest and R hip, mainly in right chest.  IS 300 at most.  Rhochi/wheezing to left lung; diminished right lung sounds but still rhodochrous, neb ordered; PAP therapy ordered.  CXR ordered; gabapentin increased to 300 mg TID; additional oxycodone 5 mg ordered.  Patient reports increased secretions - 1 ppd smoker for > 30 years.  Lovenox held; pain management consulted for possible block.  Discussed with bedside nurse    Pincus Badder, FNP-C  Trauma Acute Care Surgery  [facility]  [phone]; [phone]

## 2017-05-27 NOTE — Plan of Care (Addendum)
Problem: Pain  Goal: Pain at adequate level as identified by patient  Outcome: Progressing   05/27/17 1824   Goal/Interventions addressed this shift   Pain at adequate level as identified by patient Identify patient comfort function goal;Assess for risk of opioid induced respiratory depression, including snoring/sleep apnea. Alert healthcare team of risk factors identified.;Assess pain on admission, during daily assessment and/or before any "as needed" intervention(s);Reassess pain within 30-60 minutes of any procedure/intervention, per Pain Assessment, Intervention, Reassessment (AIR) Cycle;Offer non-pharmacological pain management interventions       Comments: Pt. Was transferred from NT 8 to [facility] 3.37 at about 14:00  Pt. Had CT scan done prior to arrival to this unit  Transferred pt. To ICU bed - pt. In severe pain after moving    N:A&O x4, moving all extremities, follows commands  R: on 4L NC when he arrived to unit - SPO2 98% - decreased to 3L NC SPO2 maintaining > 92%, rhochorous b/l, improved after RT treatment; IS 750  CV: NSR to Sinus Tachy, SBP 130s-150s, pt. States he takes 3 different meds for his blood pressure at home - he wants the Team to call his PCP for the name of his meds since he can't remember one of the three meds  GU: voiding in urinal, amber, AUO  GI: good appetite, no BM since 12/3  Musk: pt. Has not been out of bed - he states it's too painful  Skin: edema to R. Shoulder, bruising to right chest    Personal belongings transferred w/ patient: cell phone & charger

## 2017-05-27 NOTE — Discharge Instr - AVS First Page (Addendum)
TRAUMA SERVICE DISCHARGE INSTRUCTIONS    ** Follow up with the orthopedic doctors about your clavicle and pelvic fractures.      ** Call the trauma office to schedule a follow up appointment        - Please get your chest xray done prior to appointment with the trauma doctors.            [address]  [place], [place]  [address]  Phone [phone], [place] [phone]      Discharge Instructions and Follow Up Information:  -Your primary physician team during your stay was the Acute Care Surgery service (ACS) (also known as Trauma), whose surgeons specialize in General Surgery, Trauma Surgery, and Critical Care. Our surgeons are Board Certified in both General Surgery and Surgical Critical Care.    You have been provided with a list of doctors (see above) who treated you during your hospitalization.  Not all patients need to follow up with the surgeon.  If instructed to do so, please call [phone] within 48 hours of your discharge To make a Clinic appointment for the following date: Monday, December 17    The address is [address].     If you have forms that need a doctor's signature:  - If you have insurance forms, FMLA forms, or worker's compensation forms that need to be filled out by Designer, industrial/product, please mail or fax the forms to our office at the following address:  Trauma Services  [address], [address]  [place], [place] [address]  Fax: [phone]    Pain Medication  The ACS service can manage your general postoperative or post-trauma pain. If your pain is from injuries or surgery that was handled by another surgery team or consultant, such as Orthopedics, Neurosurgery, or Spine surgery, we kindly request that you call that surgeon's office to address your pain. If you have follow-up with the Trauma Service, please call the Trauma Service Office for medication refills at [phone].   Please plan accordingly as refills can take up to 24-48 hours.    Taking Pain  Medications  It is important to take your medications on time and never take more than the prescribed amount.  Use your pain medication only as directed. Take only the medication that your health care provider tells you to take.  Take your pain medications with some food to avoid an upset stomach.  Remember medications take time to work.  Most pain relievers taken by mouth take at least 20-30 minutes to take effect.  So take your medication at regular times as directed.  Don't wait until the pain gets bad to take it.  Try to time your medication so that you take it before the beginning of an activity, such as dressing or sitting at the table for dinner.  Taking your medication at night may help you get a good night's rest.  If the pain lessens, try taking your pain medication less often. Start by trying to eliminate a dose of pain medication at a time of day when you experience less pain.  If you are not able to completely eliminate the dose, try replacing the dose with a dose of acetaminophen (Tylenol), unless you have been directed not to use acetaminophen use by a physician. In this way, you can slowly wean your use of pain medication as your pain decreases, continuing to eliminate or replace other doses as described above, until you no longer need  to take any medicine for pain.   Constipation is a common side effect of pain medications.  Eating lots of fruits and vegetables and drinking lots of water helps relieve constipation.  You should be taking a stool softener (ex. Colace) as long as you are on any narcotic pain medication.  You should be having a bowel movement at least every 2-3 days.  If not, then proceed with over the counter laxatives (Senokot -S/Miralax), enemas, or suppositories to fix the constipation. You can obtain these at your local pharmacy if you develop constipation.     Avoid drinking alcohol while taking pain medicine.    Avoid driving or operating machinery while taking pain medication.    Please keep track of how many pills you have left so you will not completely run out before you need a refill.     Call your doctor if you notice any of these symptoms:    . Nausea, vomiting, diarrhea, lasting constipation, or stomach cramps  . Breathing problems or a fast heart rate  . Feeling very tired, sluggish, or dizzy  . Skin rash  . Pain that is not being treated by the prescribed amount of medication          [] Hide copied text  Home Health Referral                                                                            Referral from Chrstine Laveda Norman (Case Manager) for home health care upon discharge.    By [employer], the patient has the right to freely choose a home care provider.  Arrangements have been made with:     A company of the patients choosing. We have supplied the patient with a listing of providers in your area who asked to be included and participate in Medicare.   [facility], formerly [facility], a home care agency that provides adult home care services and participates in Medicare   The preferred provider of your insurance company. Choosing a home care provider other than your insurance company's preferred provider may affect your insurance coverage.      Home Health Discharge Information     Your doctor has ordered Physical Therapy and Occupational Therapy in-home service(s) for you while you recuperate at home, to assist you in the transition from hospital to home.      The agency that you or your representative chose to provide the service:  Name of Home Health Agency: Other (comment box) ([facility] [phone])]      The above services were set up by:  Artemio Aly  [facility] Liaison)   Phone               [phone]                                IF YOU HAVE NOT HEARD FROM YOUR HOME YOUR HOME HEALTH AGENCY WITHIN 24-48 HOURS AFTER DISCHARGE PLEASE CALL YOUR AGENCY TO ARRANGE A TIME FOR YOUR FIRST VISIT. FOR ANY SCHEDULING CONCERNS  OR QUESTIONS RELATED TO HOME HEALTH, SUCH AS TIME OR DATE PLEASE CONTACT  YOUR HOME HEALTH AGENCY AT THE NUMBER LISTED ABOVE

## 2017-05-27 NOTE — Progress Notes (Signed)
Upon re-examination, patient remains on 4LNC; GCS 15; pain improved after PO oxycodone given.  O2 sat 92%.      CXR with right sided rib fractures, increased atelectasis on the right with small pleural effusion.  Discussed with trauma attending, will transfer to [facility] for aggressive pulmonary toileting, pain control, mobilization    Pincus Badder, FNP-C  Trauma Acute Care Surgery  [facility]  [phone]; [phone]

## 2017-05-27 NOTE — Plan of Care (Signed)
Problem: Pain  Goal: Pain at adequate level as identified by patient  Outcome: Progressing   05/27/17 0336   Goal/Interventions addressed this shift   Pain at adequate level as identified by patient Identify patient comfort function goal   Pt A&O X4.  He has low O2 sat 87% on RA.  Placed pt on 2LNC.  O2 sat increased back 94-96% on 2LNC.  Pt was coughing copious amt of clear sputum.  Lung sounds had wheezes in L  Lung.  Congested lung sound on R lung field.    Tolerated whole pills with thin liquids.  Pt was not able to ambulate far with PT, according to pt.   He was having too much pain.  He could not reposition himself in bed without assistance.   Encouraged TCDB and IS use.   Continent of bowel and bladder. LBM pta(prior to admission). Pt voiding via urinal qs. Pain Controlled with oxycodone alternating or together with flexeril.  Good pedal pulses bilaterally,cap refill <3 sec in all 4 extremities.  All dressings are clean, dry, and intact and all sites appear free from visual signs of infection.    Call bell and bedside table within reach at all times. Will continue to monitor patient closely.     Cherly Beach  05/27/17  3:36 AM

## 2017-05-27 NOTE — Progress Notes (Signed)
TRAUMA TERTIARY SURVEY FORM AND INITIAL PROGRESS NOTE       Interval History:   Stanley Kim is a 61 y.o. male who was admitted 05/26/2017  3:55 AM to [facility] by Lacinda Axon, [place]* after Fall: Yes; transferred from [facility]    Substance usage:   social drinker  The patient currently smokes 1.5 packs of cigarettes per day for the past 35-40 years.  denied.    SBRT (Screening, Brief Intervention, and Referral to Treatment) performed: Yes  CATS (Community addiction team) requested: Yes    Allergies:   No Known Allergies    Medical history:     Past Medical History:   Diagnosis Date   . Acute pain due to trauma 05/26/2017   . Hypertension        Medications:     Prior to Admission medications    Medication Sig Start Date End Date Taking? Authorizing Provider   amLODIPine (NORVASC) 5 MG tablet Take 5 mg by mouth daily.   Yes [provider]   losartan (COZAAR) 50 MG tablet Take 50 mg by mouth daily.   Yes [provider]       Current Facility-Administered Medications   Medication Dose Route Frequency Last Rate Last Dose   . acetaminophen (TYLENOL) tablet 1,000 mg  1,000 mg Oral Q8H SCH   1,000 mg at 05/27/17 0733   . albuterol-ipratropium (DUO-NEB) 2.5-0.5(3) mg/3 mL nebulizer 3 mL  3 mL Nebulization Q6H SCH   3 mL at 05/27/17 0905   . amLODIPine (NORVASC) tablet 5 mg  5 mg Oral Daily   5 mg at 05/27/17 0838   . cyclobenzaprine (FLEXERIL) tablet 5 mg  5 mg Oral TID PRN   5 mg at 05/27/17 0734   . gabapentin (NEURONTIN) capsule 300 mg  300 mg Oral Q8H SCH       . ketorolac (TORADOL) injection 15 mg  15 mg Intravenous 4 times per day   15 mg at 05/27/17 0732   . lidocaine (LIDODERM) 5 % 1 patch  1 patch Transdermal Q24H   1 patch at 05/27/17 0739   . naloxone [facility]) injection 0.2 mg  0.2 mg Intravenous PRN       . oxyCODONE (ROXICODONE) immediate release tablet 5 mg  5 mg Oral Q4H PRN   5 mg at 05/27/17 0037   . senna-docusate (PERICOLACE) 8.6-50 MG per tablet 1 tablet   1 tablet Oral QHS   1 tablet at 05/26/17 2107        Verify appropriate medications are reconciled: Yes    Review of systems since admission:   Review of Systems   Eyes: Negative for blurred vision and double vision.   Respiratory: Positive for cough, shortness of breath and wheezing.    Cardiovascular: Positive for chest pain. Negative for palpitations.   Gastrointestinal: Negative for abdominal pain, nausea and vomiting.   Musculoskeletal: Positive for joint pain. Negative for back pain and neck pain.   Neurological: Negative for tingling and headaches.       Available radiology data:     Radiology Results (24 Hour)     Procedure Component Value Units Date/Time    XR Chest AP Portable [[id number]] Collected:  05/27/17 0910    Order Status:  Completed Updated:  05/27/17 0928    Narrative:       HISTORY:  Follow-up rib fractures    COMPARISON:  CT on 05/25/2017    FINDINGS:  Portable AP  view of the chest was obtained. Multiple  right-sided rib fractures are noted posteriorly and laterally. It is  difficult to assess which fractures are acute versus remote. There is a  mildly displaced oblique fracture through the mid right clavicle. There  is increased atelectasis on the right, and a small pleural effusion is  present. No pneumothorax is identified. The cardiac silhouette is normal  in caliber. Pulmonary vasculature is within normal limits.      Impression:           1. Multiple right rib fractures, both acute and remote. Please refer to  the CT study for better visualization.  2. Increased atelectasis on the right with a small pleural effusion.     Gerlene Burdock, MD   05/27/2017 9:24 AM    XR Hips BI min 5 vw with pelvis [[id number]] Collected:  05/26/17 1151    Order Status:  Completed Updated:  05/26/17 1159    Narrative:       History: Hip and pelvic pain after trauma. Right iliac fracture.    AP, inlet and outlet views of the pelvis with frog-leg right and left  hips: The bladder is distended with excreted contrast  from the CT last  night, mildly limiting visibility. The backboard also limits detail. Hip  degeneration is severe, left worse than right. The right iliac fracture  remains mildly displaced. No pelvic canal narrowing is detected.      Impression:        Mildly displaced right iliac fracture, similar to the CT  last night. Severe hip arthritis, worse on the left. No pelvic canal  narrowing.    [name], MD   05/26/2017 11:55 AM    XR Clavicle Right [[id number]] Collected:  05/26/17 1038    Order Status:  Completed Updated:  05/26/17 1044    Narrative:       HISTORY: Trauma    COMPARISON: CT from 05/25/2017    FINDINGS:   Oblique fracture of the midshaft of the right clavicle with no  significant displacement. AC joint is normal. Multiple right posterior  rib fractures are noted.      Impression:            1. Oblique fracture of the midshaft of the right clavicle.  2. Partially imaged right rib fractures.    [name], MD   05/26/2017 10:40 AM          Current laboratory data:     Results     Procedure Component Value Units Date/Time    Basic Metabolic Panel [[id number]]  (Abnormal) Collected:  05/27/17 0354    Specimen:  Blood Updated:  05/27/17 0500     Glucose 120 (H) mg/dL      BUN 9.0 mg/dL      Creatinine 0.7 mg/dL      Calcium 8.5 mg/dL      Sodium 086 (L) mEq/L      Potassium 4.6 mEq/L      Chloride 101 mEq/L      CO2 28 mEq/L     Hemolysis index [[id number]]  (Abnormal) Collected:  05/27/17 0354     Updated:  05/27/17 0500     Hemolysis Index 19 (H)    GFR [[id number]] Collected:  05/27/17 0354     Updated:  05/27/17 0500     EGFR >60.0    CBC and differential (QAM x 1) [[id number]]  (Abnormal) Collected:  05/27/17 0354    Specimen:  Blood from Blood Updated:  05/27/17 0428     WBC 7.89 x10 3/uL      Hgb 11.4 (L) g/dL      Hematocrit 16.1 (L) %      Platelets 175 x10 3/uL      RBC 3.56 (L) x10 6/uL      MCV 101.1 (H) fL      MCH 32.0 pg      MCHC 31.7 (L) g/dL      RDW 14 %      MPV 9.6 fL       Neutrophils 85.1 %      Lymphocytes Automated 6.3 %      Monocytes 6.2 %      Eosinophils Automated 1.5 %      Basophils Automated 0.5 %      Immature Granulocyte 0.4 %      Nucleated RBC 0.0 /100 WBC      Neutrophils Absolute 6.71 x10 3/uL      Abs Lymph Automated 0.50 x10 3/uL      Abs Mono Automated 0.49 x10 3/uL      Abs Eos Automated 0.12 x10 3/uL      Absolute Baso Automated 0.04 x10 3/uL      Absolute Immature Granulocyte 0.03 x10 3/uL      Absolute NRBC 0.00 x10 3/uL     Basic Metabolic Panel [[id number]]  (Abnormal) Collected:  05/26/17 1451    Specimen:  Blood Updated:  05/26/17 1902     Glucose 133 (H) mg/dL      BUN 9.0 mg/dL      Creatinine 0.7 mg/dL      Calcium 8.3 (L) mg/dL      Sodium 811 (L) mEq/L      Potassium 4.3 mEq/L      Chloride 97 (L) mEq/L      CO2 24 mEq/L     Hemolysis index [[id number]]  (Abnormal) Collected:  05/26/17 1451     Updated:  05/26/17 1902     Hemolysis Index 23 (H)    GFR [[id number]] Collected:  05/26/17 1451     Updated:  05/26/17 1902     EGFR >60.0          Physical examination:   Physical Exam   Constitutional: He is oriented to person, place, and time. He appears distressed.   HENT:   Head: Normocephalic and atraumatic.   Eyes: Pupils are equal, round, and reactive to light. EOM are normal.   Neck: Normal range of motion.   Cardiovascular: Regular rhythm and intact distal pulses.    Tachycardic   Pulmonary/Chest: No stridor. He is in respiratory distress. He has wheezes. He exhibits tenderness.   Rhonci L > R  Diminished R side  4LNC   Abdominal: Soft. Bowel sounds are normal. He exhibits no distension. There is no tenderness.   Genitourinary:   Genitourinary Comments: No foley   Musculoskeletal: He exhibits tenderness (RUE). He exhibits no edema or deformity.   Decreased ROM RUE  BUE, BLE grossly normal, ROM/Neurovasc intact  CTL spine w/ no deformities, no TTP   Neurological: He is alert and oriented to person, place, and time. GCS score is 15.   Skin: Skin is warm and  dry.   Bruising to right shoulder       Vital signs:  Temp:  [96.5 F (35.8 C)-98.4 F (36.9 C)] 96.5 F (35.8 C)  Heart Rate:  [82-106] 106  Resp Rate:  [16-22] 22  BP: (140-202)/(64-89) 161/79    List of injuries by system:   Musculoskeletal: Right clavicle fx; R 1st, 4th-8th rib fx, right illiac wing fx    Problem list:     Patient Active Problem List   Diagnosis   . Closed fracture of six ribs of right side   . Closed nondisplaced dome fracture of right acetabulum   . Acute pain due to trauma   . Closed displaced fracture of shaft of right clavicle   . Closed fracture of multiple ribs of right side, initial encounter       Verify problem list is appropriately updated: Yes    Consulting services:   Orthopedics - Actor services have been notified: Yes    Assessment and plan:   Neurological: GCS 15, CT head/spine negative; multimodal pain control including tylenol, gabapentin, toradol, PRN flexeril and oxycodone; consult for pain services for possible rib block  Orthopedic: Right clavicle fx; R iliac wing fx; nonop; NWB to RUE; WBAT RLE  Pulmonary: Currently on 4LNC; o2 sat 89-92% Will order nebs, CXR; Hx smoking; IS only 200-300; continue aggressive pulmonary toileting  Cardiac: Hx. HTN; Norvasc; continue to monitor VS  Endocrine: NAI  Gastrointestinal: Regular diet; continue BR  Renal: Voiding freely, continue to monitor  Hematologic/ID: Afebrile, no abx indicated  Candidate for Lovenox: Yes  Neuromuscular: PT/OT  Cervical spine clearance: Yes  Psychiatric: Supportive care, CATS/CIWA ordered for +ETOH on OSH  Wounds: LWC PRN      Signed by Pincus Badder 05/27/17 9:39 AM    Trauma surgeon attestation:     I have seen the patient, duplicated the exam and reviewed the flow sheet, labs and imaging studies and edited the note.  I agree with the assessment and plan.    Upgraded to [facility] due to increased respiratory rate and worsening hypoxia. CXR showed increased right lung field  opacification with volume loss. CT scan showed right lung atelectasis and unchanged small hemothorax, no need for chest tube.    Particia Lather, MD, FACS

## 2017-05-28 ENCOUNTER — Encounter: Payer: Self-pay | Admitting: Specialist

## 2017-05-28 ENCOUNTER — Encounter
Admission: EM | Disposition: A | Discharge status: Home Health | Payer: Self-pay | Source: Home / Self Care | Attending: Surgery

## 2017-05-28 HISTORY — PX: INSERTION, PAIN PUMP (MEDICAL): SHX4385

## 2017-05-28 SURGERY — INSERTION, PAIN PUMP (MEDICAL)
Anesthesia: Block | Laterality: Right

## 2017-05-28 MED ORDER — ALBUTEROL-IPRATROPIUM 2.5-0.5 (3) MG/3ML IN SOLN
3.00 mL | RESPIRATORY_TRACT | Status: DC
Start: 2017-05-28 — End: 2017-05-30
  Administered 2017-05-28 – 2017-05-30 (×11): 3 mL via RESPIRATORY_TRACT
  Filled 2017-05-28 (×10): qty 3

## 2017-05-28 MED ORDER — POLYETHYLENE GLYCOL 3350 17 G PO PACK
17.00 g | PACK | Freq: Every day | ORAL | Status: DC
Start: 2017-05-28 — End: 2017-05-30
  Administered 2017-05-28 – 2017-05-30 (×3): 17 g via ORAL
  Filled 2017-05-28 (×3): qty 1

## 2017-05-28 MED ORDER — KETOROLAC TROMETHAMINE 30 MG/ML IJ SOLN
15.00 mg | Freq: Four times a day (QID) | INTRAMUSCULAR | Status: AC
Start: 2017-05-28 — End: 2017-05-29
  Administered 2017-05-28 – 2017-05-29 (×4): 15 mg via INTRAVENOUS
  Filled 2017-05-28 (×4): qty 1

## 2017-05-28 MED ORDER — OXYCODONE HCL 5 MG PO TABS
5.00 mg | ORAL_TABLET | Freq: Four times a day (QID) | ORAL | Status: DC
Start: 2017-05-28 — End: 2017-05-30
  Administered 2017-05-28 – 2017-05-30 (×8): 5 mg via ORAL
  Filled 2017-05-28 (×7): qty 1

## 2017-05-28 MED ORDER — ENOXAPARIN SODIUM 30 MG/0.3ML SC SOLN
30.00 mg | Freq: Two times a day (BID) | SUBCUTANEOUS | Status: DC
Start: 2017-05-28 — End: 2017-05-30
  Administered 2017-05-28 – 2017-05-30 (×4): 30 mg via SUBCUTANEOUS
  Filled 2017-05-28 (×4): qty 0.3

## 2017-05-28 MED ORDER — ROPIVACAINE HCL 2 MG/ML IJ SOLN
INTRAMUSCULAR | Status: DC
Start: 2017-05-28 — End: 2017-05-30
  Filled 2017-05-28: qty 550

## 2017-05-28 NOTE — Research Progress Note (Signed)
05/28/2017: Coordinator received prior clearance by Dr. Lynnell Chad from Trauma to approach patient for the Prevent Clot Study.    Patient was approached this morning to participate in the Prevent Clot study (Title: Prevention of Clot in Orthopaedic Trauma: A Randomized Pragmatic Trial Comparing the Complications and Safety of Blood Clot Prevention Medicine Used in Orthopaedic Trauma Patients; Principal Investigator: Dr. Jeni Salles, MD; Dept of Orthopaedic Surgery). A brief overview was provided, outlining the purpose of the study (comparing Lovenox vs. Aspirin in preventing blood clots), reviewed participation is voluntary, withdrawal at any time, explained study procedures (optional weekly contact- e.g. telephone, e-mail) and surveys at the 3 month follow-up in the clinic. Explained the differences between standard routine care and research.     Standard routine care would be a prescription for Lovenox; research would be randomizing to either Lovenox or Aspirin twice a day. Either medication would be billed to insurance as prevention of blood clots is standard of care. At the end of the 3 month visit, there is a $20 compensation for participation. Consent and brochure was provided for patient  to read and review. Patient would like to discuss further with his wife. Arrangements were made to follow-up with the patient around noon.    Pandora Leiter, M.A., CCRP, Astronomer, Orthopaedic Trauma [phone]

## 2017-05-28 NOTE — Progress Notes (Signed)
SW sent preliminary SNF referrals.     SW will continue to follow for discharge needs.    Stephaney Steven, MSW  Social Worker II  [facility]  [phone]  on Weekends call [phone]

## 2017-05-28 NOTE — PT Progress Note (Signed)
Physical Therapy Cancellation Note    Patient: Stanley Kim  WCB:[id number]    Unit: F337/F337.01    Patient not seen for physical therapy secondary to pt going to surgery.  Will follow    TIME:  1400

## 2017-05-28 NOTE — Plan of Care (Addendum)
Problem: Pain  Goal: Pain at adequate level as identified by patient  Outcome: Progressing   05/28/17 1945   Goal/Interventions addressed this shift   Pain at adequate level as identified by patient Identify patient comfort function goal;Assess for risk of opioid induced respiratory depression, including snoring/sleep apnea. Alert healthcare team of risk factors identified.;Assess pain on admission, during daily assessment and/or before any "as needed" intervention(s);Reassess pain within 30-60 minutes of any procedure/intervention, per Pain Assessment, Intervention, Reassessment (AIR) Cycle;Evaluate if patient comfort function goal is met;Offer non-pharmacological pain management interventions       Problem: Potential for Inadequate gas exchange  Goal: Adequate oxygenation and ventilation is maintained  Outcome: Progressing   05/28/17 1945   Goal/Interventions addressed this shift   Adequate oxygenation and ventilation is maintained Monitor/assess patient's mental status and LOC;Monitor/assess patient's respiratory status (RR, depth, effort, breath sounds);Monitor/assess O2 saturation;Position patient for maximum ventilatory efficiency, head of bed at 30 degrees;Teach/reinforce use of incentive spirometer 10 times per hour while awake, cough and deep breath as needed       Comments: N: pt is A&O x4, moving all extremities, grip to L. Hand > R. Hand  R: titrated oxygen to 1L, satting at 97%, encourage IS and acapella; strong cough, thick secretions - using yankeer to suction; rhonchi  CV: NSR, V.S. Stable, afebrile  GI: good appetite, adequate PO intake, no BM since admission, + flatus  GU: voiding adequately  Skin: scattered bruising  Pain : pt. States pain is 8 out of 10; administering prn meds, and had nerve block placed today    Pt is asking for nicotine patch, this nurse asked the Team during rounds and followed up w/ on-call resident - no orders for nicotine patch placed  Patient's home medication list was  modified, primary team to add his home meds    Family brought in patient clothing & his wallet - wallet placed in the safe.  No cash in wallet.  Cell phone & charger remains at bedside.

## 2017-05-28 NOTE — Consults (Signed)
Secondary Alcohol Screening (AUDIT)     Total from initial SBIRT screening: SBIRT Alcohol Score: 7   How often during the last year have you found that you were not able to stop drinking once you had started? 0 = Never   How often during the last year have you failed to do what was normally expected from you because of drinking? 0 = Never   How often during the last year have you been unable to remember what happened the night before because you had been drinking? 0 = Never   How often during the last year have you needed an alcoholic drink first thing in the morning to get yourself going after a night of heavy drinking? 0 = Never   How often during the last year have you had a feeling of guilt or remorse after drinking? 0 = Never   Have you or someone else been injured as a result of your drinking? 0 = No   Has a relative, friend, doctor, or another health professional expressed concern about your drinking or suggested you cut down? 0 = No   TOTAL (including total from initial screen): 7   AUDIT Outcome: 0-7 (No further action)     Guidelines for Interpretation of AUDIT   Score Degree of Problems Related to Alcohol Abuse Suggested Action   0-7 None No further action   8-15 At risk Conduct brief intervention (BI)   16-19 Moderate AUD Conduct brief treatment (BT)   20+ Severe AUD Referral to Treatment (RT)     Met with pt for CATS consult. Pt presented as alert, polite and cooperative, but was guarded and did not want to engage in significant discussion regarding his alcohol use.    The patient completed an SBIRT screening tool today and the total score suggests: No risk or low risk of health problems related to substance use. Pt reported that he engages in "social drinking", typically "a six-pack of beer" with friends 2-3 times per week while watching football. Pt endorsed that he does drink alone sometimes, although could not quantify how often. Pt tied his drinking to his football watching. Pt stated he has no  concerns about his alcohol use.    We did not discuss this further because: The patient's low risk did not warrant further discussion and The patient expressed an unwillingness to do so.    In discussing this issue my advice was that the patient: Cut back to no more than 4 drinks in one day and no more than 14 per week (men)    The patient agreed to: Cut back to the advised daily and weekly limits    The patient's readiness to change was 0 on a scale of 0-10.  We explored why it was not a higher number and discussed the patient's own motivation for change. Pt has no concerns about his current alcohol use and has no desire to either increase or decrease his drinking.     Total time administering and interpreting the screening form, plus performing a face-to-face brief intervention with the patient was Less than 15 (CPT 96160 can be billed) minutes.    Theressa Stamps, M.Ed., [facility]  CATS Addictions Counselor

## 2017-05-28 NOTE — Discharge Summary (Signed)
Discharge Summary    Date Time: 05/31/17 1:20 PM  Patient Name: Kim, Stanley  Attending Physician: No att. providers found  Service:  1: Trauma/Acute Care Surgery    Date of Admission:   05/26/2017    Date of Discharge:   12/8/201812/02/2017     Reason for Admission:   Closed fracture of multiple ribs of right side, initial encounter [S22.41XA]  Closed displaced fracture of right clavicle, unspecified part of clavicle, initial encounter [S42.001A]    Problems:   Lists the present on admission hospital problems  Present on Admission:  . Closed fracture of six ribs of right side  . Closed nondisplaced dome fracture of right acetabulum  . Closed displaced fracture of shaft of right clavicle      Hospital Problems:  Active Problems:    Closed fracture of six ribs of right side    Closed nondisplaced dome fracture of right acetabulum    Acute pain due to trauma    Closed displaced fracture of shaft of right clavicle    Closed fracture of multiple ribs of right side, initial encounter      Discharge Dx:     1. Closed fracture of multiple ribs of right side, initial encounter    2. Closed displaced fracture of right clavicle, unspecified part of clavicle, initial encounter        Consultations:   Orthopedics: Stephannie Peters MD  Anesthesia - pain    Procedures performed:   12/5: Rib block    HPI:   Stanley Kim is 61 y.o. male that presents to the hospital with multiple right rib fractures, right clavicle fracture, and right iliac wing fracture after a fall.        Hospital Course:   Stanley Kim is a 61 y.o male that presents to the hospital with multiple right rib fractures, right clavicle fracture, and right iliac wing fracture after a fall.  Orthopedics (Dr.Malekzadeh) was consulted and determined both fractures to be non op at this time.  He was placed on the ward.  Overnight 12/4-12/5 had increasing pain and dyspnea.  He was upgraded to the [facility] for pulmonary toileting and better pain control.  On 12/6 he had a  rib block placed by the anesthesia pain service.  After this, his pain and breathing greatly improved and he was able to be weaned from the oxygen. He worked with PT/OT, who recommended AR vs home w/supervision and DME at the time of discharge. Patient refused to go to rehab facility, so home health and DME was set up and he was cleared for discharge home with his wife and daughter on 12/9      Discharge Medications:     Discharge Medication List as of 05/30/2017 12:27 PM      START taking these medications    Details   acetaminophen (TYLENOL) 500 MG tablet Take 2 tablets (1,000 mg total) by mouth every 8 (eight) hours., Starting Sat 05/30/2017, OTC      cyclobenzaprine (FLEXERIL) 5 MG tablet Take 1 tablet (5 mg total) by mouth 3 (three) times daily as needed for Muscle spasms., Starting Sat 05/30/2017, Print      enoxaparin (LOVENOX) 30 MG/0.3ML Solution Inject 0.3 mLs (30 mg total) into the skin every 12 (twelve) hours., Starting Sat 05/30/2017, Until Sat 07/11/2017, Print      gabapentin (NEURONTIN) 300 MG capsule Take 1 capsule (300 mg total) by mouth every 8 (eight) hours., Starting Sat 05/30/2017, Print  lidocaine (LIDODERM) 5 % Place 1 patch onto the skin daily.Remove & Discard patch within 12 hours or as directed by MD, Starting Sat 05/30/2017, Print      oxyCODONE (ROXICODONE) 5 MG immediate release tablet Take 1 tablet (5 mg total) by mouth every 4 (four) hours as needed for Pain., Starting Sat 05/30/2017, Print      polyethylene glycol (MIRALAX) packet Take 17 g by mouth daily., Starting Sun 05/31/2017, OTC         CONTINUE these medications which have NOT CHANGED    Details   amLODIPine (NORVASC) 5 MG tablet Take 5 mg by mouth daily., Historical Med      diclofenac (VOLTAREN) 50 MG EC tablet Take 100 mg by mouth daily., Historical Med      losartan (COZAAR) 50 MG tablet Take 50 mg by mouth daily., Historical Med      metoprolol tartrate (LOPRESSOR) 25 MG tablet Take 25 mg by mouth 2 (two) times daily.,  Historical Med      pantoprazole (PROTONIX) 40 MG tablet Take 40 mg by mouth daily., Historical Med             Disposition:   Home     Discharge Instructions:      Activity: NWB RUE, TTWB RLE, avoid activities which cause pain. . No driving while taking narcotic pain medication.  Diet: regular diet .The patient is instructed to drink plenty of fluids to maintain adequate hydration.    Wound Care: as directed . Showering is ok, but no baths or submerging incision in standing water for 2-3 weeks.     Follow Up:   [facility]  [address]  [address]  [place] [address]  [phone]  Schedule an appointment as soon as possible for a visit on 06/08/2017  Call to schedule follow up appointment with a repeat chest [facility]  [address]  [place] [address]  [phone]  Follow up      [facility]  [phone]  Call  As needed for questions regarding home health services      Signed by: Estrella Deeds    At the time of discharge the patient was given instructions detailing his discharge care and time was taken to answer questions.

## 2017-05-28 NOTE — Progress Notes (Signed)
SW met with patient at bedside to introduce role, provide contact information, and complete IDPA.     Patient lives with wife and has a daughter in college.     SW provided patient with SNF list and discussed SNF discharge. Patient is reluctant to commit to SNF and would prefer to go home.    Awaiting for PT/OT recommendations today.        05/28/17 1032   Patient Type   Within 30 Days of Previous Admission? No   Healthcare Decisions   Interviewed: Patient   Orientation/Decision Making Abilities of Patient Alert and Oriented x3, able to make decisions   Additional Emergency Contacts? Wife: Johnny Bridge [phone]/ Daughter: Eileen Stanford [phone]   Prior to admission   Prior level of function Independent with ADLs;Ambulates with assistive device   Type of Residence Private residence   Home Layout Two level;Able to live on main level with bedroom/bathroom  (16 STE)   Have running water, electricity, heat, etc? Yes   Living Arrangements Spouse/significant other   How do you get to your MD appointments? self   How do you get your groceries? self   Who fixes your meals? self   Who does your laundry? self   Who picks up your prescriptions? self   Dressing Independent   Grooming Independent   Feeding Independent   Bathing Independent   Toileting Independent   DME Currently at [facility] wheel walker;Crutches   Discharge Planning   Support Systems Spouse/significant other;Children   Patient expects to be discharged to: Home   Anticipated [place] plan discussed with: Same as interviewed   Potential barriers to discharge: Decreased mobility;Other  (home alone)   Mode of transportation: Private car (family member)   Consults/Providers   PT Evaluation Needed 1   OT Evalulation Needed 1   SLP Evaluation Needed 2   Outcome Palliative Care Screen Screened but did not meet criteria for intervention   Correct PCP listed in Epic? Yes   Important Message from [facility] Notice   Patient received 1st IMM Letter? Yes     Debby Bud, MSW  Social  Worker II  [facility]  [phone]  on [facility] call [phone]

## 2017-05-28 NOTE — Progress Notes (Signed)
05/28/17 0744   Patient Assessment   Status Completed   RT Priority 2   Pulmonary History Other (Comment)  (current smoker)   Airway type Other (Comment)  (natural)   Surgical Status Other (Comment)  (multiple rib fractures)   Mobility Ambulatory with or without assistance   CXR (Multiple right rib fractures)   Cough Spontaneous;Congested;Strong   Resp Rate 22   Bilateral Breath Sounds Diminished;Rhonchi;Expiratory wheezes   Home regimen   Home Treatments no   Home Oxygen no   Home CPAP/BiLevel no   Oxygen Therapy   SpO2 97 %   O2 Device Nasal cannula   O2 Flow Rate (L/min) 2 L/min   Incentive Spirometry   Incentive Spirometry Achieved (mL) 500 mL   Criteria for Therapy   Secretion Clearance (BPH) Evidence of secretions unable to clear with spontaneous cough or suctioning   Lung Expansion Rib fracture or fractures   Respiratory Medications Documented wheezing  within the last 24 hrs;New or pre-existing diagnosed airways disease   Oxygen Mild/Moderate: O2 < or equal to 5 lpm NC to maintain SpO2> or equal to 90%   Severity Index   Severity Index MODERATE   Outcomes   Secretion Clearance (BPH)  Improved secretion clearance   Lung expansion  IS improved   Respiratory medication Decreased wheezing, air movement or symptoms   Oxygen goals Goals not met   Recommendations   Secretion Clearance (BPH) Increase therapy   Lung Expansion Increase therapy   Respiratory Medications Increase therapy   Performing Departments   O2 Device performing department formula [id number]   Resp assess adult performing department formula [id number]   Nebs given performing department formula [id number]   Oxygen performing department formula [id number]   Per respiratory PDP, the Duo-neb and PAP will be changed to Q4H and PEP therapy will be started Q4HWA to assist with secretion clearance. For any quesitons please call [phone], thanks.

## 2017-05-28 NOTE — Progress Notes (Signed)
[facility] ACUTE CARE SURGERY / TRAUMA PROGRESS NOTE     Date/Time: 05/28/17 6:40 AM  Patient Name: Stanley Kim  Primary Care Physician: Alinda Dooms, MD  Hospital Day: 2    Assessment/Plan:   The patient has the following active problems:  Patient Active Problem List   Diagnosis   . Closed fracture of six ribs of right side   . Closed nondisplaced dome fracture of right acetabulum   . Acute pain due to trauma   . Closed displaced fracture of shaft of right clavicle   . Closed fracture of multiple ribs of right side, initial encounter       Plan by systems:  Neuro: Scheduled oxycodone for pain control, continue ATC tylenol, gabapentin, PRN flexeril, toradol, and additional 5mg  oxycodone PRN  Seizure Prophylaxis not indicated  Pulm: Currently on 2.5 L NC, O2 sats 91 on RA, 95-98% on 2.5L, Tachypneic with movement, Anesthesia pain service to see patient today for rib block, continue neb treatments increase frequency from Q6 to Q4, pulm toileting including IS, mobilization, cough and deep breath, and PAP therapy  CV: On home Amlodipine and cozaar, HDS, continue to monior  Endo: NAI, encourage Gatorade with meals for sodiums  GI: Regular diet, bowel regimen  GI Prophylaxis:  No prophylaxis need, patient is on a diet   Heme/ID: afebrile, no abx  DVT Prophylaxis: enoxaparin 30 BID on hold at this time for rib block  Renal: AUOP, encourage fluids  Foley: no  Neuromuscular: R clavicle fx (non- op)R iliac wing fx, B/L knee pain, ambulate as able with assistance  Weight Bearing Right Left   Upper Extremity NWB WBAT   Lower Extremity WBAT WBAT   PT/OT: yes  Psych: Supportive   Wounds: LWC prn  Dispo: pending PT/OT re-eval, last recommendation, SNF    Orthopedics - [facility]ade[facility]    Interval History:   Stanley Kim is a 61 y.o. male who presents to the hospital after Fall: Yes.     Significant overnight events include continues to have increased DOE (minimal exertion), increasing pain    Allergies:   No Known  Allergies    Medications:     Current Facility-Administered Medications   Medication Dose Route Frequency Provider Last Rate Last Dose   . acetaminophen (TYLENOL) tablet 1,000 mg  1,000 mg Oral [facility] Sherril Cong, MD   1,000 mg at 05/28/17 0507   . albuterol-ipratropium (DUO-NEB) 2.5-0.5(3) mg/3 mL nebulizer 3 mL  3 mL Nebulization Q6H [facility] Pincus Badder, FNP   3 mL at 05/28/17 0214   . amLODIPine (NORVASC) tablet 5 mg  5 mg Oral Daily Sherril Cong, MD   5 mg at 05/27/17 [phone]   . cyclobenzaprine (FLEXERIL) tablet 5 mg  5 mg Oral TID PRN Sherril Cong, MD   5 mg at 05/27/17 1433   . enoxaparin (LOVENOX) syringe 30 mg  30 mg Subcutaneous Once Alain Honey, MD       . gabapentin (NEURONTIN) capsule 300 mg  300 mg Oral [facility] Pincus Badder, FNP   300 mg at 05/28/17 0507   . lidocaine (LIDODERM) 5 % 1 patch  1 patch Transdermal Q24H Sherril Cong, MD   1 patch at 05/27/17 [phone]   . losartan (COZAAR) tablet 50 mg  50 mg Oral Daily Pincus Badder, FNP   50 mg at 05/27/17 1236   . naloxone [facility]) injection 0.2 mg  0.2 mg Intravenous PRN Letha Cape  F, MD       . oxyCODONE (ROXICODONE) immediate release tablet 5 mg  5 mg Oral Q4H PRN Sherril Cong, MD   5 mg at 05/28/17 0507   . senna-docusate (PERICOLACE) 8.6-50 MG per tablet 1 tablet  1 tablet Oral QHS Sherril Cong, MD   1 tablet at 05/27/17 2145       Labs:     Results     Procedure Component Value Units Date/Time    B-type Natriuretic Peptide [[id number]] Collected:  05/27/17 1106    Specimen:  Blood Updated:  05/27/17 1356     B-Natriuretic Peptide 47 pg/mL           Rads:   Radiological Procedure reviewed.    Radiology Results (24 Hour)     Procedure Component Value Units Date/Time    CT Chest WO Contrast [[id number]] Collected:  05/27/17 1418    Order Status:  Completed Updated:  05/27/17 1427    Narrative:       History: Shortness of breath. Trauma patient, evaluate for hemothorax.    Chest CT without contrast: The  following dose reduction techniques were  utilized: Automated exposure control and/or adjustment of the mA and/or  kV according to patient size, and the use of iterative reconstruction  technique. Multiple rib fractures of different ages are redemonstrated,  unchanged from the outside CT two days ago. A small to moderate right  pleural effusion demonstrates slightly increased density and has  significantly enlarged since the CT two days ago. Much of the right lung  is now newly densely consolidated, particularly in the right upper and  lower lobes. No pericardial effusion or pneumothorax has developed. The  heart size is normal. Coronary arteries are calcified. The thoracic  aorta is not aneurysmal.      Impression:        Multiple rib fractures of different ages, some acute. Small  to moderate right hemothorax and extensive right lung consolidation  development since the outside CT two days ago.    [name], MD   05/27/2017 2:23 PM    XR Chest AP Portable [[id number]] Collected:  05/27/17 0910    Order Status:  Completed Updated:  05/27/17 0928    Narrative:       HISTORY:  Follow-up rib fractures    COMPARISON:  CT on 05/25/2017    FINDINGS:  Portable AP view of the chest was obtained. Multiple  right-sided rib fractures are noted posteriorly and laterally. It is  difficult to assess which fractures are acute versus remote. There is a  mildly displaced oblique fracture through the mid right clavicle. There  is increased atelectasis on the right, and a small pleural effusion is  present. No pneumothorax is identified. The cardiac silhouette is normal  in caliber. Pulmonary vasculature is within normal limits.      Impression:           1. Multiple right rib fractures, both acute and remote. Please refer to  the CT study for better visualization.  2. Increased atelectasis on the right with a small pleural effusion.     Gerlene Burdock, MD   05/27/2017 9:24 AM          Physical Exam:   Temp:  [96.5 F (35.8 C)-98.4 F  (36.9 C)] 98.1 F (36.7 C)  Heart Rate:  [4-107] 86  Resp Rate:  [16-32] 22  BP: (136-202)/(63-92) 153/72    Vital Signs:  Vitals:  05/28/17 0600   BP: 153/72   Pulse: 86   Resp: 22   Temp:    SpO2: 96%       I/O:  Intake and Output Summary (Last 24 hours) at Date Time  I/O last 3 completed shifts:  In: 480 [P.O.:480]  Out: 2350 [Urine:2350]    Output:         Nutrition:   Orders Placed This Encounter   Procedures   . Diet regular Fluid restriction: OTHER (SEE COMMENTS) (Please give gatorade with meals)       Physical Exam:  Physical Exam   Constitutional: He is oriented to person, place, and time. He appears well-developed and well-nourished. He appears distressed (Mild resp. distress, increased with movement).   HENT:   Head: Normocephalic and atraumatic.   Eyes: Pupils are equal, round, and reactive to light. EOM are normal.   Neck: Normal range of motion. Neck supple. No tracheal deviation present.   Cardiovascular: Normal rate, regular rhythm and intact distal pulses.  Exam reveals no friction rub.    No murmur heard.  Pulmonary/Chest: He is in respiratory distress (mild resp. distress, tachypnea and wheezing, increased with movement). He has wheezes. He exhibits tenderness (Right).   Course rhonchi throughout, diminished on Right  No chest crepitus  IS 450   Abdominal: Soft. Bowel sounds are normal. There is no tenderness.   Genitourinary:   Genitourinary Comments: Voiding via urinal   Musculoskeletal: He exhibits tenderness (B/L Knees (baseline)). He exhibits no edema or deformity.   Neurological: He is alert and oriented to person, place, and time.   GCS 15   Skin: Skin is warm and dry.   Psychiatric: He has a normal mood and affect. His behavior is normal.   Nursing note and vitals reviewed.       Neita Carp, FNP-C  Trauma Acute Care Surgery  Specta # [phone]    Attending Attestation:   I saw and examined the patient at 1000h. My exam and findings duplicated as documented in the note above by Ms  Terrilee Croak. I have personally added to the text of this note to reflect my exam and medical assessment and plan.  I have personally reviewed the patient's history and 24 hour interval events, along with vitals, labs, radiology images, ventilator settings and additional findings found in detail within the note above. I concur with the above documented assessment and care plan which was developed with and reviewed by me except as documented below.     IMP/PLAN  Acute pain due to trauma cont multimodal reg, add rib block today for better control  Acute resp insuff due to 6 right rib fx, aggressive pulm toilet    Facial swelling, PERRL/EOMI, nl nasal/oral mucosa   no stridor, trachea midline   Lungs Coarse bilat/symmetric, dec effort due to pain  CV: RRR; 2+radial/dp bilat   Abd: soft, nt/nd  Ext: no deform; 5/5 strength throughout   Skin: no rashes/lesions/ulcers   Neuro: alert and oriented x 3; no focal deficits   Psych: Nl mood and affect    Larwance Sachs, MD, FACS  Acute Care Surgery

## 2017-05-28 NOTE — OT Progress Note (Signed)
Occupational Therapy Cancellation Note    Patient: Stanley Kim  NGE:[id number]    Unit: F337/F337.01    Patient not seen for occupational therapy secondary to pt leaving unit for pre-op pain pump insertion. Will continue to follow.    Jacalyn Lefevre, MS, OTR/L  Pager [phone]

## 2017-05-28 NOTE — Plan of Care (Signed)
Problem: Pain  Goal: Pain at adequate level as identified by patient  Outcome: Progressing   05/27/17 1824   Goal/Interventions addressed this shift   Pain at adequate level as identified by patient Identify patient comfort function goal;Assess for risk of opioid induced respiratory depression, including snoring/sleep apnea. Alert healthcare team of risk factors identified.;Assess pain on admission, during daily assessment and/or before any "as needed" intervention(s);Reassess pain within 30-60 minutes of any procedure/intervention, per Pain Assessment, Intervention, Reassessment (AIR) Cycle;Offer non-pharmacological pain management interventions       Problem: Potential for Inadequate gas exchange  Goal: Adequate oxygenation and ventilation is maintained  Outcome: Progressing   05/28/17 0143   Goal/Interventions addressed this shift   Adequate oxygenation and ventilation is maintained Monitor/assess patient's mental status and LOC;Monitor/assess patient's respiratory status (RR, depth, effort, breath sounds);Monitor/assess O2 saturation;Position patient for maximum ventilatory efficiency, head of bed at 30 degrees;Teach/reinforce use of incentive spirometer 10 times per hour while awake, cough and deep breath as needed;Apply CPAP as ordered during any periods of rest

## 2017-05-28 NOTE — Anesthesia Procedure Notes (Signed)
Peripheral    Reason for block: Acute pain management  Injection technique: Catheter  Block Region: Paravertebral - Thoracic  Laterality: Right  Start time: 05/28/2017 3:00 PM  End time: 05/28/2017 3:30 PM    Staffing  Anesthesiologist: Ardith Dark  Other anesthesia staff: Hessie Diener  Performed: Anesthesiologist     Pre-procedure Checklist   Completed: patient identified, surgical consent, pre-op evaluation, timeout performed, risks and benefits discussed, anesthesia consent given and correct site      Peripheral Block  Patient monitoring: Pulse oximetry, EKG, Nasal cannula O2, Face mask O2, Circuit O2 and NIBP  Patient position: Sitting  Premedication: Yes  Local infiltration: Lidocaine 1%    Needle  Needle type: Tuohy   Needle gauge: 17 G  Needle length: 10 cm  Catheter size: 18 G    Procedures: ultrasound guided  Ultrasound Guided: LA spread visualized, Relevant anatomy identified (nerve, vessels, muscle), Catheter visualized and Air contrast visualized      Assessment   Incremental injection: yes  Injection made incrementally with aspirations every 5 mL.  Injection Resistance: no  Paresthesia Pain: No    Blood Aspirated: No  no suspected intravascular injection  Patient tolerated procedure well: Yes  Block Outcome: No complications, Successful block and Pain improved

## 2017-05-28 NOTE — Progress Notes (Signed)
Right paravertebral peripheral nerve block (with catheter) completed. Vital signs monitored and charted. Sterile protocol maintained. Patient tolerated procedure well. Discussed expected outcomes, (pain control,decreased opioid requirements and side effects, limb safety). Patient verbalized understanding.      Anesthesia doctor, Lencyzk at bedside

## 2017-05-28 NOTE — Consults (Signed)
Consults  Acute Pain Service ANESTHESIOLOGY PAIN SERVICE CONSULTATION    Reason for Consult: Pain Management Evaluationi    CC:  Multiple rib fractures on the right side     HPI: Pain Location:  righ  rib cage     Severity:  10    ROS: Associated Signs & Symptoms: Sleep Apnea    PMH:   Patient has no known allergies.     Pain Management Rx:  Medical management    PEx:   BP 152/70   Pulse 92   Temp 36.7 C (98.1 F) (Oral)   Resp (!) 28   Ht 1.702 m (5\' 7" )   Wt 81.6 kg (180 lb)   SpO2 97%   BMI 28.19 kg/m       General Appearance:  Moderated distress      Neuro: Level of Consciousness: awake and alert      Airway Class: III     Dental: intact     Lungs: expiratory rhonchi and inspiratory rhonchi     Heart: Regular     Other:     Assessment:  Pain Diagnosis:  Thoracic radiculopathy secondary to multiple right rib fractures and poor poulmonary function secondary to splinting     Comorbidities: Sleep Apnea    Plan:  Will do right sided paravertebral blocks    Level of Service:  2    Ardith Dark

## 2017-05-28 NOTE — Progress Notes (Incomplete)
VSS, afebrile.  Pain managed

## 2017-05-28 NOTE — Research Progress Note (Signed)
05/28/2017: Patient is alert and oriented x 3.  Patient was seen to follow-up regarding his interest in participating in the PreventClot study.  Patient agreed to participate in the study.      Patient consented to:  PREVENTion of Clot in Orthopaedic Trauma (PREVENT CLOT): A Randomized Pragmatic Trial Comparing the Complications and Safety of Blood Clot Prevention Medicines Used in Orthopaedic Trauma Patients     Consent version: 1, Approved 12/31/2016  Who obtained the ICF: Erling Arrazola Programmer, multimedia)   Who signed the ICF: Fredna Dow (Patient) & Elana Jian (Research Coordinator)       Prior to consent, clearance to approach patient for possible enrollment in the PreventClot Study was provided by Dr. Lynnell Chad from Trauma.     Consent was reviewed with patient page by page to ensure patient understood the study requirements.. The consent process reinforced the following: participation is voluntary, purpose of the study, withdrawal at any time, study procedures, data collected, length of study, risks and benefits, privacy of information, costs associated with standard of care vs. research, injury, contact for injury associated with research, and authorizations.     Patient expressed understanding and all questions answered to satisfaction.  Consent was signed; witnessed by individual providing consent.      Copy of the signed consent/HIPPA along with contact information for coordinator and surgeon will be provided to patient. Original consent will be filed in research chart. The signed consent will also be faxed to medical records for scanning into EPIC.    No research activity was conducted prior to consent. All baseline measures were completed per study protocol.    Patient was randomized Lovenox.     Nurse was contacted regarding anticoagulant change and order was modified by Orthopaedic Trauma PA.      --------------------------------------------------------------------------------------------------------------------     The following research procedures were completed per protocol after obtaining consent:   Lovenox: Routine Care, Bill to [employer]: Not a billable item  Study Cost center: [id number]    Pandora Leiter, M.A., CCRP, Astronomer, Orthopaedic Trauma [phone]

## 2017-05-29 ENCOUNTER — Encounter: Payer: Self-pay | Admitting: Specialist

## 2017-05-29 MED ORDER — CALCIUM CARBONATE ANTACID 500 MG PO CHEW
500.00 mg | CHEWABLE_TABLET | Freq: Four times a day (QID) | ORAL | Status: DC | PRN
Start: 2017-05-29 — End: 2017-05-30
  Administered 2017-05-29: 11:00:00 500 mg via ORAL
  Filled 2017-05-29: qty 1

## 2017-05-29 MED ORDER — FAMOTIDINE 20 MG PO TABS
20.00 mg | ORAL_TABLET | Freq: Two times a day (BID) | ORAL | Status: DC
Start: 2017-05-29 — End: 2017-05-30
  Administered 2017-05-29 – 2017-05-30 (×3): 20 mg via ORAL
  Filled 2017-05-29 (×3): qty 1

## 2017-05-29 NOTE — Plan of Care (Signed)
Problem: Safety  Goal: Patient will be free from injury during hospitalization   05/29/17 0129   Goal/Interventions addressed this shift   Patient will be free from injury during hospitalization  Assess patient's risk for falls and implement fall prevention plan of care per policy;Provide and maintain safe environment;Include patient/ family/ care giver in decisions related to safety;Hourly rounding       Problem: Potential for Inadequate gas exchange  Goal: Adequate oxygenation and ventilation is maintained   05/29/17 0129   Goal/Interventions addressed this shift   Adequate oxygenation and ventilation is maintained Monitor/assess O2 saturation;Monitor/assess patient's respiratory status (RR, depth, effort, breath sounds);Teach/reinforce use of incentive spirometer 10 times per hour while awake, cough and deep breath as needed   Alert and oriented x 4. Denies numbness or tingling. MAE and following commands. NWB RUE. Medicated per [facility] for pain, On Q pump in place. No kinking or leaking noted. Site intact. Pt states his pain in the posterior ribs has improved with block.   Aggressive pulm. Toilet. IS encouraged pulls 1250, using flutter valve. NC 1L. Will attempt to wean in the morning. Pt request nicotine patch.   NSR, VSS.   Tolerating diet, drinking gingerale and powerade.  Round, firm abd, active bowel sounds. No BM overnight.

## 2017-05-29 NOTE — Plan of Care (Signed)
Problem: Safety  Goal: Patient will be free from injury during hospitalization   05/29/17 1539   Goal/Interventions addressed this shift   Patient will be free from injury during hospitalization  Assess patient's risk for falls and implement fall prevention plan of care per policy;Provide and maintain safe environment;Use appropriate transfer methods;Include patient/ family/ care giver in decisions related to safety;Hourly rounding;Provide alternative method of communication if needed (communication boards, writing)       Problem: Pain  Goal: Pain at adequate level as identified by patient   05/29/17 1539   Goal/Interventions addressed this shift   Pain at adequate level as identified by patient Identify patient comfort function goal;Assess for risk of opioid induced respiratory depression, including snoring/sleep apnea. Alert healthcare team of risk factors identified.;Assess pain on admission, during daily assessment and/or before any "as needed" intervention(s);Reassess pain within 30-60 minutes of any procedure/intervention, per Pain Assessment, Intervention, Reassessment (AIR) Cycle;Consult/collaborate with Physical Therapy, Occupational Therapy, and/or Speech Therapy;Include patient/patient care companion in decisions related to pain management as needed       Problem: Side Effects from Pain Analgesia  Goal: Patient will experience minimal side effects of analgesic therapy   05/29/17 1539   Goal/Interventions addressed this shift   Patient will experience minimal side effects of analgesic therapy Monitor/assess patient's respiratory status (RR depth, effort, breath sounds);Assess for changes in cognitive function;Prevent/manage side effects per LIP orders (i.e. nausea, vomiting, pruritus, constipation, urinary retention, etc.)       Problem: Discharge Barriers  Goal: Patient will be discharged home or other facility with appropriate resources   05/29/17 1539   Goal/Interventions addressed this shift    Discharge to home or other facility with appropriate resources Provide appropriate patient education;Initiate discharge planning;Provide information on available health resources       Comments: Pt. A&Ox4.  RA to 1L NC for desaturations.  Slightly tachycardic into the 100s.   ONQ to the Right.  OOB to chair with 1x assist.   Voiding adequately urine is amber in color. LBM- 12/3 per pt. No BM on shift. Given stool regime.   Will CTM.

## 2017-05-29 NOTE — Research Progress Note (Signed)
Met with patient to provide follow-up details regarding his participation in the [employer] study.  Explained to patient that he was randomized to the Lovenox group.  Provided the patient with a copy of the signed consent form, study staff contact information and study medication card. Answered all the patient's questions. Will continue to follow patient for study purposes.  Benjie Karvonen, RN, BSN, [employer], [place], Phone [phone]

## 2017-05-29 NOTE — Progress Notes (Addendum)
[facility] ([facility]) Inpatient Progress Note  ==================================================================    Date:  05/29/2017  Time: 10:15 AM      Patient Name: Stanley Kim, Stanley Kim  Surgeon: * No surgeons listed *    Visit Type:  Inpatient Room #   F337/F337.01    Surgical Procedure(s):   Post-op Day: 2 Days Post-Op    Continuous Peripheral Nerve Catheter Present?: Yes    Catheter #1  Catheter Location: Thoracic Paravertebral  Laterality: Right  Days Catheter In Situ: 1  Infusion Medication and Rate: Ropivicaine 0.2% currently infusing at 10 mL/hr    Opioid Medication(s):       Oxycodone 5 mg  x  4 doses in 24 hours.    Adjuvant Analgesic Medication(s):       Gabapentin 300 mg PO q8hrs       Acetaminophen 1000 mg PO q8hrs       Lidoderm Patch   Ketorolac has been discontinued    24 Hour Vital Signs and Pain Score(s): Temp:  [97.8 F (36.6 C)-98.7 F (37.1 C)]   Heart Rate:  [80-100]   Resp Rate:  [15-35]   BP: (105-172)/(51-78)   SpO2:  [95 %-100 %]     Last recorded pain score:  Pain Scale Used: Numeric Scale (0-10)  Pain Score: 0-No pain       Subjective:  Symptoms Include:  Pt states he feels much better than yesterday,able to cough, deep breath, pulls 1000-1200 on IS    Objective: Pain score at rest:  2    Pain score with activity:  3                     Motor block:  No                     Sensory block:  Diminished                     Catheter site:   Clean, dry and intact.    Assessment:   Catheter is functioning as expected.    Plan:       - Continue local anesthetic infusion at current rate.    Would patient receive block again?  Yes    Level of Care: 2    ------------------------------------------------------------------------------------------------------------------  Melene Plan  Department of Anesthesiology  [facility]  [facility]    [facility] General Phone (All hours): [phone]  [facility] Pain Nurses (8am - 4pm):   Butler Mooney-Cotter:  [phone]   Theodosia Blender: [phone]          Anesthesiology Acute Pain Service Attending Note    Pain Service Progress Note and interval history reviewed:  agree    Patient seen and examined.    History:  mild    Physical exam:  awake and alert     Plan:  Continue    Level of Service: 2    Hulan Saas    05/29/2017  1:48 PM

## 2017-05-29 NOTE — OT Progress Note (Signed)
[facility]   Occupational Therapy Treatment     Patient: Stanley Kim    MRN#: [id number]   Unit: [facility] TOWER 3  Bed: F337/F337.01      Discharge Recommendations:   Discharge Recommendation: Acute Rehab   DME Recommended for Discharge:  (see below for alternative recs)    If Acute Rehab recommended discharge disposition is not available, patient will need hands on assist for LB ADLs, IADL assist, supervision for functional t/fs, hemiwalker, w/c equipment, one level setup with ramp entrance and HHOT.     Assessment:   Pt requires cues to maintain NWB RUE- provided sling to improve adherence to precautions for functional transfers only. Performs stand pivot with use of hemiwalker with SBA and min cues for safe hand placement, navigates environment in w/c without difficulty. Pt continues with difficulty completing LB dressing and with decreased activity tolerance 2/2 respiratory status. Pt to benefit from continued OT services to address functional deficits and maximize pt safety and independence.    Treatment Activities: ADL retraining, functional t/f training, therapeutic act, activity tolerance, patient education    Educated the patient to role of occupational therapy, plan of care, goals of therapy and safety with mobility and ADLs.    Plan:    OT Frequency Recommended: 3-4x/wk     Continue plan of care.       Precautions and Contraindications:   Falls  NWB RUE    Updated Medical Status/Imaging/Labs:  S/p pain pump placement    Subjective: "I want to go home, I can use my brother's w/c. "   Patient's medical condition is appropriate for Occupational Therapy intervention at this time.  Patient is agreeable to participation in the therapy session.    Pain:   Scale: Not rated, 4/10 moving FACEs  Location: L hip 2/2 arthritis  Intervention: pain pump in place    Objective:   Patient is seated in a bedside chair with SCDs, On-Q pump, 2L O2 via NC and peripheral IV access in  place.      Cognition  Alert&Oriented x4. Follows commands with cueing. Decreased safety awareness.    Functional Mobility  Sit to Stand: CGA  Transfers: SBA with hemiwalker stand pivot    PMP Activity: Step 5 - Chair       Balance  Static Sitting: supervision  Dynamic Sitting: SBA  Static Standing: SBA  Dynamic Standing: CGA-SBA    Self Care and Home Management  Grooming: setup  LE Dressing: mod A    Therapeutic Exercises  With activity    Participation: good  Endurance: good    Patient left with call bell within reach, all needs met, handoff to PT and all questions answered. RN notified of session outcome and patient response.     Goals:  Time For Goal Achievement: 5 visits  ADL Goals  Patient will groom self: Modified Independent, at sinkside  Patient will dress lower body: Modified Independent, with AE  Pt will complete bathing: Modified Independent  Patient will toilet: Modified Independent  Mobility and Transfer Goals  Pt will perform functional transfers: Modified Independent, with rolling walker  Other Goal:  (Will complete bed mobility with Mod I)  Neuro Re-Ed Goals  Pt will perform dynamic standing balance: Supervision, for 10 minutes, to complete standing ADLs safely                      Genia Del, OTR/L  [phone]  Time of Treatment  OT Received On: 05/29/17  Start Time: 1530  Stop Time: 1615  Time Calculation (min): 45 min    Treatment # 1 of 5 visits

## 2017-05-29 NOTE — Progress Notes (Signed)
[facility] ACUTE CARE SURGERY / TRAUMA PROGRESS NOTE     Date/Time: 05/29/17 7:03 AM  Patient Name: Stanley Kim  Primary Care Physician: Alinda Dooms, MD  Hospital Day: 3  Procedure(s):  INSERTION, PAIN PUMP (MEDICAL)   Post-op Day: 1 Day Post-Op    Assessment/Plan:   The patient has the following active problems:  Patient Active Problem List   Diagnosis   . Closed fracture of six ribs of right side   . Closed nondisplaced dome fracture of right acetabulum   . Acute pain due to trauma   . Closed displaced fracture of shaft of right clavicle   . Closed fracture of multiple ribs of right side, initial encounter       Plan by systems:  Neuro: Continue multimodal pain regimen including ATC tylenol, oxycodone, gabapentin, flexeril.     Seizure Prophylaxis not indicated  Pulm: Received rib block yesterday, Encourage frequent IS, continue nebs and pap per RT, mobilize   CV: HDS continue home medications including norvasc and cozaar, continue to monitor  Endo: NAI  GI: Regular diet, bowel regimen, encouraged mobilization  GI Prophylaxis:  No prophylaxis need, patient is on a diet   Heme/ID: afebrile, no abx at this time  DVT Prophylaxis: enoxaparin 30 BID  Renal: Voiding AUOP, encourage a variety of fluids  Foley: no  Neuromuscular: R clavicle non op and R iliac wing non op, encourage mobilization with assistive devices as needed  Weight Bearing Right Left   Upper Extremity NWB WBAT   Lower Extremity WBAT WBAT   PT/OT: yes  Psych: Supportive  Wounds: LWC prn  Dispo: SNF pending PT re-eval  Orthopedics - [facility]    Interval History:   Stanley Kim is a 61 y.o. male who presents to the hospital after Fall: Yes.     Significant overnight events include received rib block and reports better pain control    Allergies:   No Known Allergies    Medications:     Current Facility-Administered Medications   Medication Dose Route Frequency Provider Last Rate Last Dose   . acetaminophen (TYLENOL) tablet 1,000 mg  1,000 mg Oral  [facility] Sherril Cong, MD   1,000 mg at 05/29/17 0511   . albuterol-ipratropium (DUO-NEB) 2.5-0.5(3) mg/3 mL nebulizer 3 mL  3 mL Nebulization Q4H [facility] Neita Carp, FNP   3 mL at 05/29/17 0402   . amLODIPine (NORVASC) tablet 5 mg  5 mg Oral Daily Sherril Cong, MD   5 mg at 05/28/17 1610   . cyclobenzaprine (FLEXERIL) tablet 5 mg  5 mg Oral TID PRN Sherril Cong, MD   5 mg at 05/28/17 1853   . enoxaparin (LOVENOX) syringe 30 mg  30 mg Subcutaneous Q12H Neita Carp, FNP   30 mg at 05/28/17 2214   . gabapentin (NEURONTIN) capsule 300 mg  300 mg Oral [facility] Pincus Badder, FNP   300 mg at 05/29/17 0511   . lidocaine (LIDODERM) 5 % 1 patch  1 patch Transdermal Q24H Sherril Cong, MD   1 patch at 05/28/17 [phone]   . losartan (COZAAR) tablet 50 mg  50 mg Oral Daily Pincus Badder, FNP   50 mg at 05/28/17 5409   . naloxone [facility]) injection 0.2 mg  0.2 mg Intravenous PRN Sherril Cong, MD       . oxyCODONE (ROXICODONE) immediate release tablet 5 mg  5 mg Oral Q4H PRN Sherril Cong, MD  5 mg at 05/28/17 0507   . oxyCODONE (ROXICODONE) immediate release tablet 5 mg  5 mg Oral Q6H Neita Carp, FNP   5 mg at 05/29/17 0113   . polyethylene glycol (MIRALAX) packet 17 g  17 g Oral Daily Neita Carp, FNP   17 g at 05/28/17 1317   . ropivacaine (PF) (NAROPIN) 0.2 % 550 mL in On-Q pump single flow   Percutaneous Continuous Ardith Dark, MD 10 mL/hr at 05/28/17 1650     . senna-docusate (PERICOLACE) 8.6-50 MG per tablet 1 tablet  1 tablet Oral QHS Sherril Cong, MD   1 tablet at 05/28/17 2214       Labs:     Results     ** No results found for the last 24 hours. **          Rads:   Radiological Procedure reviewed.    Radiology Results (24 Hour)     ** No results found for the last 24 hours. **          Physical Exam:   Temp:  [97.8 F (36.6 C)-98.7 F (37.1 C)] 97.8 F (36.6 C)  Heart Rate:  [83-100] 84  Resp Rate:  [15-35] 20  BP: (105-172)/(51-85) 153/72      Vital  Signs:  Vitals:    05/29/17 0600   BP: 153/72   Pulse: 84   Resp: 20   Temp:    SpO2: 97%       I/O:  Intake and Output Summary (Last 24 hours) at Date Time  I/O last 3 completed shifts:  In: 1180 [P.O.:1180]  Out: 1450 [Urine:1450]    Output:          Nutrition:   Orders Placed This Encounter   Procedures   . Diet regular Fluid restriction: OTHER (SEE COMMENTS) (Please give gatorade with meals)       Physical Exam:  Physical Exam   Constitutional: He is oriented to person, place, and time. He appears well-developed and well-nourished. No distress.   HENT:   Head: Normocephalic and atraumatic.   Eyes: Pupils are equal, round, and reactive to light. Conjunctivae and EOM are normal.   Neck: Normal range of motion. Neck supple. No tracheal deviation present.   Cardiovascular: Normal rate, regular rhythm, normal heart sounds and intact distal pulses.  Exam reveals no friction rub.    No murmur heard.  Pulmonary/Chest: He has wheezes.   Rhonchi throughout, DOE, reports better pain s/p rib block  1L NC, O2 sats 97%  IS 450   Abdominal: Soft. Bowel sounds are normal. He exhibits distension. There is no tenderness.   Genitourinary:   Genitourinary Comments: Continent of urine   Musculoskeletal: Normal range of motion. He exhibits no edema or deformity.   Neurological: He is alert and oriented to person, place, and time.   Skin: Skin is warm and dry.   Psychiatric: He has a normal mood and affect. His behavior is normal. Judgment and thought content normal.   Nursing note and vitals reviewed.      Neita Carp, FNP-C  Trauma Acute Care Surgery  Specta # [phone]    Attending Attestation:   I saw and examined the patient at 1000h. My exam and findings duplicated as documented in the note above by Ms Terrilee Croak. I have personally added to the text of this note to reflect my exam and medical assessment and plan.  I have personally reviewed the patient's history and  24 hour interval events, along with vitals, labs, radiology  images, ventilator settings and additional findings found in detail within the note above. I concur with the above documented assessment and care plan which was developed with and reviewed by me except as documented below.     IMP/PLAN  Acute pain due to trauma multimodal pain regimen, better control with rib block  Acute pulm insuff due to right rib fx, aggressive pulm toilet, supplemental O2 1L NC to maintain sats> 88%    NC/AT, PERRL/EOMI, nl nasal/oral mucosa  no stridor, trachea midline   Lungs dec effort due to pain, dec bs bases bilat/symmetric   CV: RRR; 2+radial/dp bilat   Abd: soft, nt/nd  Ext: no deform; MAE  Neuro: awake and alert; no focal deficits   Psych: Nl mood and affect    Larwance Sachs, MD, FACS  Acute Care Surgery

## 2017-05-29 NOTE — PT Progress Note (Signed)
[facility]   Physical Therapy Treatment  Patient:  Stanley Kim MRN#:  [id number]  Unit: [facility] TOWER 3  Bed: F337/F337.01    Discharge Recommendations:   D/C Recommendations: Acute Rehab   DME Recommendations: Hemi walker (for transfers)     If Acute Rehab recommended discharge disposition is not available, patient will need hands on assist for transfers, hemiwalker (for transfers), wc (for distance - can borrow from brother), reacher equipment, ramp to enter home and HHPT.        Recommendations can change; please see most recent physical therapy treatment note for updates.     Assessment:   Patient s/p procedure for on-Q pump yesterday to manage pain. Patient reports improvement in discomfort today during session. Requires CGA-SBA for stand pivot transfers using hemiwalker from chair to/from wheelchair. Patient unable to ambulate d/t RUE NWB and severe hip/knee arthritis. Requires multiple rest breaks d/t respiratory status/ Will benefit from continued PT intervention to increase independence and safety      Treatment Activities:   Transfers, gait    Educated the patient to role of physical therapy, plan of care, goals of therapy and safety with mobility and ADLs, weight bearing precautions, [place] plans and recs.    Plan:   Treatment/Interventions: Gait training, Functional transfer training, LE strengthening/ROM, Bed mobility, Compensatory technique education        PT Frequency: 3-4x/wk     Continue plan of care.       Precautions and Contraindications:   Precautions  Weight Bearing Status:  (RUE NWB)  Other Precautions: fall risk, onQ pump      Admitting Medical Diagnosis:   Closed fracture of multiple ribs of right side, initial encounter [S22.41XA]  Closed displaced fracture of right clavicle, unspecified part of clavicle, initial encounter [S42.001A]      Updated Medical Status/Imaging/Labs:   No results found.  Lab Results   Component Value Date/Time    HGB 11.4 (L) 05/27/2017  03:54 AM    HCT 36.0 (L) 05/27/2017 03:54 AM    K 4.6 05/27/2017 03:54 AM    NA 132 (L) 05/27/2017 03:54 AM         Subjective:   "I gotta do what I gotta do. If I dont have a sling on this arm then I am going to use it."    Pain Assessment:  Pain Scale: numeric 0-10  Pain Score: 2-4/10  Pain Location: rue  Pain Intervention: rest, medication      Patient's medical condition is appropriate for Physical Therapy intervention at this time.  Patient is agreeable to participation in the therapy session. Nursing clears patient for therapy.    Objective:   Patient is seated in a bedside chair with peripheral IV, On-Q pump and sling in place.    Vitals:  stable    Cognition/Neuro Status:  Orientation Level: alert and oriented x4  Behavior: cooperative  Safety awareness: decr safety awareness      Functional Mobility/Transfers:  Rolling: nt  Supine to Sit: nt  Sit to Stand: cga   Stand to Sit: cga  Bed to Chair: cga-min a without ad, sba using hemiwalker  Stand Pivot: cga-min a without ad, sba using hemiwalker      PMP Activity: Step 5 - Chair    Ambulation: min-mod a with attempts to take steps forward using hemiwalker, not safe at this time      Patient Participation: good  Patient Endurance: fair  Patient left with call bell within reach, all needs met, and all questions answered. RN notified of session outcome and patient response.     SCD: in place  Fall mat: in place  Bed alarm: off  Chair alarm: on   Avasys: na      Goals:  Goals  Goal Formulation: With patient/family  Time for Goal Acheivement: 5 visits  Goals: Select goal  Pt Will Go Supine To Sit: with contact guard assist  Pt Will Perform Sit to Stand: with contact guard assist  Pt Will Transfer Bed/Chair: with rolling walker, with contact guard assist  Pt Will Ambulate: 31-50 feet, with rolling walker, with contact guard assist      Casie Sturgeon Cannady-Small, PT, DPT 4:54 PM 05/29/2017  Pager [phone]      Time of Treatment  PT Received On: 05/29/17  Start Time:  1410  Stop Time: 1436  Time Calculation (min): 26 min    Treatment # 1 out of 5 visits

## 2017-05-29 NOTE — Progress Notes (Signed)
05/29/17 1236   Patient Assessment   Status Completed   RT Priority 2   Pulmonary History Other (Comment)  (current smoker)   Mobility Non-ambulatory, can reposition self  (Patient states he is not allowed to walk at this time)   CXR (Last CXR 12/5 am)   Cough Productive   Heart Rate 97   Resp Rate 20   Bilateral Breath Sounds Diminished;Expiratory wheezes   Cough Description   Sputum Amount UTA  (Productive prior to this visit)   Home regimen   Home Treatments none   Home Oxygen none   Home CPAP/BiLevel none   Oxygen Therapy   SpO2 96 %   O2 Device None (Room air)   Incentive Spirometry   Incentive Spirometry Achieved (mL) 1250 mL   Incentive Spirometry Goal (mL) 1750 mL   Criteria for Therapy   Secretion Clearance (BPH) Evidence of secretions unable to clear with spontaneous cough or suctioning   Lung Expansion Rib fracture or fractures   Respiratory Medications Documented wheezing  within the last 24 hrs   Oxygen Mild/Moderate: O2 < or equal to 5 lpm NC to maintain SpO2> or equal to 90%   Severity Index   Severity Index MODERATE   Outcomes   Secretion Clearance (BPH)  Improved secretion clearance;Patient able to clear secretions sponaneously or with suctioning   Lung expansion  IS improved   Respiratory medication No change or improvement   Oxygen goals Maintain room air SpO2 > 90%   Recommendations   Secretion Clearance (BPH) Change to self-admin   Lung Expansion Change to self-admin   Respiratory Medications Continue current therapy   Patient with good improvement in secretion clearance and lung expansion. Patient using IS at volumes of 1000 - independently. Patient also using Acapella for PEP therapy independently and clearing secretions on own. PAP therapy and assistance with Acapella discontinued per respiratory PDP. Nebulizer therapy Q4 continued as patient still presents with expiratory wheezes prior to each treatment.

## 2017-05-29 NOTE — Progress Notes - Trauma (Signed)
TSN Intern met with pt to provide psychosocial support. Pt was awake and fully engaged in conversation. Pt reports that he fell down 7 concrete stairs when he was on crutches. Pt reports that he lost consciousness and woke up in the ambulance. Pt reports that he is planning to move to [facility] with his wife and reports that he is anxious to get out of the hospital. Pt has one daughter who is currently in college at [employer]. TSN Intern administered ITSS screen.     Before this Injury PTSD DEP   1. Have you ever taken medication for, or been given a mental health diagnosis      0      2. Has there ever been a time in your life you have been bothered by feeling down or hopeless or lost all interest in things you usually enjoyed for more than 2 weeks?      0          When you were injured or right afterward     3. Did you think you were going to die?         0         0   4. Do you think this was done to you intentionally?        0    Since your injury     5. Have you felt emotionally detached from your loved ones?          0   6. Do you find yourself crying and are unsure why?          0   7. Have you felt more restless, tense or jumpy than usual?         0    8. Have you found yourself unable to stop worrying?         0    9. Do you find yourself thinking that the world is unsafe and that people are not to be trusted?         0    > 2 is positive for PTSD risk  > 2 is positive for Depression risk  SUM= 0 0     Pt was at no risk for PTSD and no risk for depression. TSN Intern provided brief psychoeducation about PTSD and depression.  TSN materials and contact information was not left with pt.     Ilda Foil   [facility] Intern  [phone]

## 2017-05-29 NOTE — UM Notes (Signed)
12/7 csr  61 y.o. male who presents to the hospital after Fall: Yes.     Diagnosis   . Closed fracture of six ribs of right side   . Closed nondisplaced dome fracture of right acetabulum   . Acute pain due to trauma   . Closed displaced fracture of shaft of right clavicle   . Closed fracture of multiple ribs of right side, initial encounter       Significant overnight events include received rib block and reports better pain control  Plan by systems:  Neuro: Continue multimodal pain regimen including ATC tylenol, oxycodone, gabapentin, flexeril.     Seizure Prophylaxis not indicated  Pulm: Received rib block yesterday, Encourage frequent IS, continue nebs and pap per RT, mobilize   CV: HDS continue home medications including norvasc and cozaar, continue to monitor  Endo: NAI  GI: Regular diet, bowel regimen, encouraged mobilization  GI Prophylaxis:  No prophylaxis need, patient is on a diet   Heme/ID: afebrile, no abx at this time  DVT Prophylaxis: enoxaparin 30 BID  Renal: Voiding AUOP, encourage a variety of fluids  Foley: no  Neuromuscular: R clavicle non op and R iliac wing non op, encourage mobilization with assistive devices as needed  Weight Bearing Right Left   Upper Extremity NWB WBAT   Lower Extremity WBAT WBAT   PT/OT: yes  Psych: Supportive  Wounds: LWC prn  Dispo: SNF pending PT re-eval  Orthopedics - [facility]    [place] planning note:   SW spoke with patient top choices for SNF 1) [facility] 2) [facility].    [facility] not in network.    SW spoke with [place] at [facility] [phone] and facility will start auth.    Vs-97.8, 85, 20, 153/72, 97% 2L O2 nc,   No new labs  Reg diet, duonebs q4h, lovenox sq bid, iv toradol qid, ciwa protocol q4h,     Cordelia Pen MSN, [employer], ACM  Utilization Review Case Manager  [employer]  [phone]

## 2017-05-29 NOTE — Progress Notes (Signed)
SW spoke with patient top choices for SNF 1) [facility] 2) [facility].    [facility] not in network.    SW spoke with [place] at [facility] [phone] and facility will start auth.    SW will continue to follow for discharge needs.    Debby Bud, MSW  Social Worker II  [facility]  [phone]  on [place] call [phone]

## 2017-05-30 ENCOUNTER — Other Ambulatory Visit: Payer: Self-pay

## 2017-05-30 MED ORDER — POLYETHYLENE GLYCOL 3350 17 G PO PACK
17.00 g | PACK | Freq: Every day | ORAL | 0 refills | Status: AC
Start: 2017-05-31 — End: ?

## 2017-05-30 MED ORDER — OXYCODONE HCL 5 MG PO TABS
5.0000 mg | ORAL_TABLET | ORAL | 0 refills | Status: DC | PRN
Start: 2017-05-30 — End: 2017-06-08

## 2017-05-30 MED ORDER — LIDOCAINE 5 % EX PTCH
1.00 | MEDICATED_PATCH | Freq: Every day | CUTANEOUS | 0 refills | Status: AC
Start: 2017-05-30 — End: ?

## 2017-05-30 MED ORDER — OXYCODONE HCL 5 MG PO TABS
10.00 mg | ORAL_TABLET | ORAL | Status: DC | PRN
Start: 2017-05-30 — End: 2017-05-30
  Administered 2017-05-30 (×2): 10 mg via ORAL
  Filled 2017-05-30: qty 2

## 2017-05-30 MED ORDER — ENOXAPARIN SODIUM 30 MG/0.3ML SC SOLN
30.00 mg | Freq: Two times a day (BID) | SUBCUTANEOUS | 0 refills | Status: AC
Start: 2017-05-30 — End: 2017-07-11

## 2017-05-30 MED ORDER — CYCLOBENZAPRINE HCL 5 MG PO TABS
5.00 mg | ORAL_TABLET | Freq: Three times a day (TID) | ORAL | 0 refills | Status: AC | PRN
Start: 2017-05-30 — End: ?

## 2017-05-30 MED ORDER — GABAPENTIN 300 MG PO CAPS
300.00 mg | ORAL_CAPSULE | Freq: Three times a day (TID) | ORAL | 0 refills | Status: DC
Start: 2017-05-30 — End: 2017-06-08

## 2017-05-30 MED ORDER — OXYCODONE HCL 5 MG PO TABS
5.00 mg | ORAL_TABLET | ORAL | Status: DC | PRN
Start: 2017-05-30 — End: 2017-05-30
  Filled 2017-05-30 (×2): qty 1

## 2017-05-30 MED ORDER — ACETAMINOPHEN 500 MG PO TABS
1000.00 mg | ORAL_TABLET | Freq: Three times a day (TID) | ORAL | 0 refills | Status: AC
Start: 2017-05-30 — End: ?

## 2017-05-30 NOTE — Plan of Care (Signed)
Problem: Safety  Goal: Patient will be free from injury during hospitalization  Outcome: Progressing   05/30/17 1346   Goal/Interventions addressed this shift   Patient will be free from injury during hospitalization  Assess patient's risk for falls and implement fall prevention plan of care per policy;Provide and maintain safe environment;Ensure appropriate safety devices are available at the bedside;Include patient/ family/ care giver in decisions related to safety;Hourly rounding       Problem: Pain  Goal: Pain at adequate level as identified by patient  Outcome: Not Progressing   05/30/17 1346   Goal/Interventions addressed this shift   Pain at adequate level as identified by patient Identify patient comfort function goal;Assess for risk of opioid induced respiratory depression, including snoring/sleep apnea. Alert healthcare team of risk factors identified.;Assess pain on admission, during daily assessment and/or before any "as needed" intervention(s);Reassess pain within 30-60 minutes of any procedure/intervention, per Pain Assessment, Intervention, Reassessment (AIR) Cycle       Problem: Potential for Inadequate gas exchange  Goal: Adequate oxygenation and ventilation is maintained  Outcome: Adequate for Discharge   05/30/17 1346   Goal/Interventions addressed this shift   Adequate oxygenation and ventilation is maintained Monitor/assess patient's mental status and LOC;Monitor/assess O2 saturation;Teach/reinforce use of incentive spirometer 10 times per hour while awake, cough and deep breath as needed

## 2017-05-30 NOTE — Progress Notes (Signed)
Liaiaosn spoke with PT and patient at bedside regarding the need for wheelchair to discharge home. Explained to both PT and patient DME companies are closed over the weekend and cannot provide WC prior to D/C home. Patient states can get WC set up from a friend prior to his discharge today. PT also aware of this.

## 2017-05-30 NOTE — Progress Notes (Signed)
Home Health Referral          Referral from Chrstine Laveda Norman (Case Manager) for home health care upon discharge.    By [employer], the patient has the right to freely choose a home care provider.  Arrangements have been made with:     A company of the patients choosing. We have supplied the patient with a listing of providers in your area who asked to be included and participate in Medicare.   [facility], formerly [facility], a home care agency that provides adult home care services and participates in Medicare   The preferred provider of your insurance company. Choosing a home care provider other than your insurance company's preferred provider may affect your insurance coverage.      Home Health Discharge Information     Your doctor has ordered Physical Therapy and Occupational Therapy in-home service(s) for you while you recuperate at home, to assist you in the transition from hospital to home.      The agency that you or your representative chose to provide the service:  Name of Home Health Agency: Other (comment box) ([facility] [phone])]      The above services were set up by:  Artemio Aly  [facility] Liaison)   Phone               [phone]                                IF YOU HAVE NOT HEARD FROM YOUR HOME YOUR HOME HEALTH AGENCY WITHIN 24-48 HOURS AFTER DISCHARGE PLEASE CALL YOUR AGENCY TO ARRANGE A TIME FOR YOUR FIRST VISIT. FOR ANY SCHEDULING CONCERNS OR QUESTIONS RELATED TO HOME HEALTH, SUCH AS TIME OR DATE PLEASE CONTACT YOUR HOME HEALTH AGENCY AT THE NUMBER LISTED ABOVE      Additional comments:     Home Health face-to-face (FTF) Encounter (Order [id number])   Consult   Date: 05/30/2017 Department: [facility] Ordering/Authorizing: Martha Clan, MD   Order Information     Order Date/Time Release Date/Time Start Date/Time End Date/Time   05/30/17 12:10 PM None 05/30/17 12:06 PM 05/30/17 12:06 PM   Order Details     Frequency  Duration Priority Order Class   Once 1 occurrence Routine Hospital Performed   Standing Order Information     Remaining Occurrences Interval Last Released      0/1 Once 05/30/2017            Provider Information     Ordering User Ordering Provider Authorizing Provider   Artemio Aly, RN Martha Clan, MD Martha Clan, MD   Attending Provider(s) Admitting Provider PCP   Dot Been, MD; Lacinda Axon, Lanora Manis, MD Martha Clan, MD Mahboob, Adron Bene, MD   Verbal Order Info     Action Created on Order Mode Entered by Responsible Provider Signed by Signed on   Ordering 05/30/17 1210 Telephone with Ardelle Balls, RN Martha Clan, MD     Order Questions     Question Answer Comment   Date of face-to-face (FTF) encounter: 05/30/2017    Medical conditions that necessitate Home Health care: B. Functional impairment due to recent hospitalization/procedure/treatment     C. Risk for complication/infection/pain requiring follow up and monitoring     E. Exacerbation of disease requiring follow up monitoring    Clinical findings that  support the need for Skilled Nursing. SN will: O. N/A    Clinical findings that support the need for Physical Therapy. PT will A. Evaluate and treat functional impairment and improve mobility     C. Educate on weight bearing status, stair/gait training, balance & coordination     D. Provide services to help restore function, mobility, and releive pain     E. Educate on functional mobility; bed, chair, sit, stand and transfer activities     F. Perform home safety assessment & develop safe in home exercise program     H. Educate on the safe use of assistive device/ durable medical equipment     G. Implement activities to improve stance time, cadence & step length    Clinical findings support the need for OT (needs SN/PT order).OT will A. Develop in home program to improve ability to perform ADLs     B. Develop  restorative program to improve mobility and independence    Clinical findings that support the need for SLP. ST will F. N/A    Per clinical findings, following services are medically necessary: PT     OT    Evidence this patient is homebound because: B. Profound weakness, poor balance/unsteady gait d/t illness/treatment/procedure     C. Decreased endurance, strength, ROM, cadence, safety/judgment during mobility     G. Fall risk due to impaired coordination, gait and decreased balance     E. Requires assistance of another person or assistive device to ambulate > 5 feet     M. Unsteady gait, poor ambulation with history of falls    Other (please specify) Dr Erskine Emery, Adron Bene to follow in the community          Process Instructions     Please select Home Care Services medically necessary.     Based on the above findings, I certify that this patient is confined to the home and needs intermittent skilled nursing care, physical therapry and / or speech therapy or continues to need occupational therapy. The patient is under my care, and I have initiated the establishment of the plan of care. This patient will be followed by a physician who will periodically review the plan of care.         Collection Information     Consult Order Info     ID Description Priority Start Date Start Time   [id number] Home Health face-to-face (FTF) Encounter Routine 05/30/2017 12:06 PM   Provider Specialty Referred to   ______________________________________ _____________________________________   Acknowledgement Info     For At Acknowledged By Acknowledged On   Placing Order 05/30/17 1210 Artemio Aly, RN 05/30/17 1210   Verbal Order Info     Action Created on Order Mode Entered by Responsible Provider Signed by Signed on   Ordering 05/30/17 1210 Telephone with Ardelle Balls, RN Martha Clan, MD     Patient Information     Patient Name  Stanley Kim, Stanley Kim Sex  Male DOB  Jun 03, 1956   Additional Information      Associated Reports External References   Priority and Order Details [employer]       [facility] REFERRAL       Patient's Name: Stanley Kim, Stanley Kim [facility] Date: 06/01/2017   DOB: 01-13-1956 From: Dante Gang  At: [phone]   MRN: [id number] For Physician: Dr  Alinda Dooms   Attending Physician: Lacinda Axon, Serina Cowper* For (PT, OT,)   T LACE       RI  Note - Patient Info:   Patient new address: [address]                                         [place] [address]  Patient cell number [phone] Best contact      Language/Communication Needs:   English     Acuity:       Isolation Precautions:   No active isolations      Diagnoses:     Patient Active Problem List    Diagnosis Date Noted   . HClosed fracture of six ribs of right side [S22.41XA] 05/26/2017   . HClosed nondisplaced dome fracture of right acetabulum [S32.484A] 05/26/2017   . HAcute pain due to trauma [G89.11] 05/26/2017   . HClosed displaced fracture of shaft of right clavicle [S42.021A] 05/26/2017   . HClosed fracture of multiple ribs of right side, initial encounter [S22.41XA]        Admission Date:   05/26/2017    Discharge Date:   05/30/2017    Recent Surgery, Date of Surgery, and Provider Performing:   Procedure(s):  INSERTION, PAIN PUMP (MEDICAL)  05/28/2017  Surgeon(s):  Ardith Dark, MD    Allergies:   No Known Allergies        Height and Weight:     Ht Readings from Last 1 Encounters:   05/26/17 1.702 m (5\' 7" )     Wt Readings from Last 1 Encounters:   05/26/17 81.6 kg (180 lb)       Immunizations:     There is no immunization history on file for this patient.    Past Medical History:        Past Medical History:   Diagnosis Date   . Acute pain due to trauma 05/26/2017   . Hypertension        Past Surgical History:     Past Surgical History:   Procedure Laterality Date   . CATARACT EXTRACTION, BILATERAL     . INSERTION, PAIN PUMP (MEDICAL) Right 05/28/2017    Procedure: INSERTION, PAIN PUMP (MEDICAL);  Surgeon: Ardith Dark, MD;  Location: [facility];  Service: Anesthesiology;  Laterality: Right;       Social History:     Social History     Social History   . Marital status: Married     Spouse name: N/A   . Number of children: N/A   . Years of education: N/A     Social History Main Topics   . Smoking status: Current Every Day Smoker   . Smokeless tobacco: Current User   . Alcohol use Yes   . Drug use: No   . Sexual activity: Yes     Other Topics Concern   . Not on file     Social History Narrative   . No narrative on file       Willing and Able Caregiver:   Yes wife    SKILLED NURSING (SN): Skilled assessment and medication instruction   [phone] s/p mechanical fall down stairs with R clavicle fx, R 1, 4-8 acute rib fx (+additional rib fx in various stages of healing), R acetabular fx.   Closed fracture of six ribs of right side   . Closed nondisplaced dome fracture of right acetabulum   . Acute pain due to trauma   . Closed displaced fracture of shaft of right  clavicle       PHYSICAL THERAPY (PT):   D/C Recommendations: Acute Rehab (pt refusing AR. Home with hands on assist, HHPT.) Pt will need transport into home or family to use w/c to lift pt up 1 step.  And w/c level of function.  DME Recommendations: w/c, L crutch (pt has), BSC.   Treatment Activities: gait training, therapeutic activity, transfer training  Plan:   PT Frequency: 3-4x/wk  Precautions and Contraindications:   Fall  NWB R UE  Pt insistent on d/c home instead of rehab.  At this point he is unsafe to mobilize without hands on assist.  He also admits that he will use R UE when home.  Pt able to ambulate for limited distances with L crutch and close CGA.  If d/c home, pt will need hands on assist and recommend pt stay at w/c level of function with HHPT. HH liason present and info given for equipment and rehab needs.   OCCUPATIONAL THERAPY (OT):   Eval.  and treat  OT Frequency Recommended: 3-4x/wk   Precautions and Contraindications:   Falls  NWB RUE  Treatment  Activities: ADL retraining, functional t/f training, therapeutic act, activity tolerance, patient education      Insurance      [facility]  [facility]        Patient Name: Stanley Kim, Stanley Kim     MRN: [id number]     CSN: [id number]       Account Information    Hosp Acct #   [id number] Patient Class   Inpatient Service  Trauma Accommodation Code  Semi-Private     Admission Information    Admitting Physician:  Attending Physician: Lacinda Axon, Serina Cowper*  [facility]* Unit  [facility]* L&D Status     Admitting Diagnosis: Closed fracture of multiple ribs of right side, initial encounter; Closed * Room / Bed  F337/F337.01 L&D - Last Menstrual Cycle     Chief Complaint: Fall     Admit Type:  ED Admit Date/Time:  Admission Date/Time:  Discharge Date: Emergency  05/26/17 0353  05/26/17 0458   Length of Stay: 4 Days   L&D EDD   Estimated Date of Delivery: None noted.     Patient Information            Home Address: [address]  [place] [address] Employer:  Employer Address:     ,     Main Phone: [phone] Employer Phone:    SSN: [id number]     DOB: 12-19-1955 (61 yrs)     Sex: Male Primary Care Physician: Alinda Dooms, *   Marital Status: Married Primary Care Physician Phone: [phone]   Race: Clif[place]on Asters or Caucasian Referring Physician: No ref. provider found   Ethnicity: Non Hispanic/Latino Religion:    Emergency Contacts  Name Home Phone Work Phone Mobile Phone Relationship [place], Stanley Kim   [phone] Stanley Kim, Stanley Kim   [phone] Daughter         Guarantor Information    Guarantor Name: Stanley Kim, Stanley Kim ID: [id number]   Guarantor Relationship to Pt: Self Guarantor Type: Personal/Family   Guarantor DOB:   11-11-1955     Guarantor Address: [address] [place], [place] [address]       Guarantor Home Phone: [phone] Guarantor Employer:        Guarantor Work Phone:  Pensions consultant Emp Phone:  Primary  Insurance    Insurance Name: MEDICARE [facility] MEDICARE HMO Subscriber Name: MAD[employer], GARLITZ   Insurance Address:   [address] [address]  [place], [place] [address] Subscriber DOB: 25-Oct-1955     Subscriber ID: [id number]   Insurance Phone: [phone] Pt Relationship to Sub:   Self   Insurance ID:      Group Name:  Preauthorization #:    Group #: [id number] Preauthorization Days:      Secondary Insurance    Insurance Name: - Subscriber Name:    Insurance Address:     ,   Statistician DOB:      Subscriber ID:    Press photographer:  Pt Relationship to Sub:      Insurance ID:      Group Name:  Preauthorization #:    Group #:  Preauthorization Days:      [employer] Name: Scientist, clinical (histocompatibility and immunogenetics) Name:    Community education officer Address:     ,   Statistician DOB:      Subscriber ID:    Press photographer:  Pt Relationship to Sub:      Insurance ID:      Group Name:  Preauthorization #:    Group #:  Preauthorization Days:        05/30/2017 12:21 PM

## 2017-05-30 NOTE — PT Progress Note (Signed)
[facility]   Physical Therapy Treatment  Patient:  Stanley Kim MRN#:  [id number]  Unit: [facility]  Bed: F337/F337.01    Discharge Recommendations:   D/C Recommendations: Acute Rehab (pt refusing AR. Home with hands on assist, HHPT.) Pt will need transport into home or family to use w/c to lift pt up 1 step.  And w/c level of function.  DME Recommendations: w/c, L crutch (pt has), BSC.       Assessment:   Pt insistent on d/c home instead of rehab.  At this point he is unsafe to mobilize without hands on assist.  He also admits that he will use R UE when home.  Pt able to ambulate for limited distances with L crutch and close CGA.  If d/c home, pt will need hands on assist and recommend pt stay at w/c level of function with HHPT. HH liason present and info given for equipment and rehab needs.     Treatment Activities: gait training, therapeutic activity, transfer training    Educated the patient to role of physical therapy, plan of care, goals of therapy and safety with mobility and ADLs, weight bearing precautions, home safety.    Plan:   PT Frequency: 3-4x/wk    Continue plan of care.       Precautions and Contraindications:   Fall  NWB R UE    Updated Medical Status/Imaging/Labs: chart reviewed    Subjective:    "I want to go home!"  Patient's medical condition is appropriate for Physical Therapy intervention at this time.  Patient is agreeable to participation in the therapy session. Nursing clears patient for therapy.    Pain:   Scale: 8/10  Location: R side  Intervention: RN aware      Objective:   Patient is seated on toilet with dressings, telemetry, SCD's, peripheral IV, On-Q pump and shoulder sling in place.    Cognition  A and O x 3. Follows all commands.    Functional Mobility  Rolling: n/a  Supine to Sit: n/a  Scooting: with CGA  Sit to Stand: min A  Stand to Sit: CGA and max cues  Transfers: CGA to min A    Ambulation  PMP - Progressive Mobility Protocol   PMP  Activity: Step 6 - Walks in Room  Distance Walked (ft) (Step 6,7): 15 Feet   Level of Assistance required: crutch L hand  Pattern: decreased step length B LE, no heel strike R (walks foot forward)  Device Used: L crutch  Weightbearing Status: NWB R UE            Balance  Static Sitting: good  Dynamic Sitting: good  Static Standing: fair to fair-  Dynamic Standing: fair-        Patient Participation: good  Patient Endurance: good, productive cough    Patient left with call bell within reach, all needs met, SCDs in place, fall mat in place, bed alarm n/a, chair alarm in place and all questions answered. RN notified of session outcome and patient response.     Goals:  Goals  Goal Formulation: With patient/family  Time for Goal Acheivement: 5 visits  Goals: Select goal  Pt Will Go Supine To Sit: with contact guard assist  Pt Will Perform Sit to Stand: with contact guard assist  Pt Will Transfer Bed/Chair: with rolling walker, with contact guard assist  Pt Will Ambulate: 31-50 feet, with rolling walker, with contact guard assist  Mitchel Honour) Craige Cotta, PT, DPT  Pager [phone]      Time of Treatment:  PT Received On: 05/30/17  Start Time: 0940  Stop Time: 1025  Time Calculation (min): 45 min  Treatment # 2 out of 5 visits

## 2017-05-30 NOTE — Plan of Care (Addendum)
TACS Nursing Progress Note    Stanley Kim is a 61 y.o. male  Admitted 05/26/2017  3:55 AM [facility] day 4) for Closed fracture of multiple ribs of right side, initial encounter [S22.41XA]  Closed displaced fracture of right clavicle, unspecified part of clavicle, initial encounter [S42.001A]        Major Shift Events:  Severe pain at beginning of shift rated 10/10 after pt moved from chair to bed.  Oxycodone and flexeril given for pain and pain management education given to pt.  Pt fell asleep around midnight.      Review of Systems  Neuro:  A&Ox4. MAE. Follows commands.       Cardiac:  Last bp 152/77, HR 86.  bp in 170s-180s when in pain.      Respiratory:  RA, sating well.  Expiratory wheezes, wet strong productive cough. Pt reports he smokes 1 pack of cigarettes/day.    GI/GU:  Amber urine.  lbm 12/3 per pt    BM this shift? no    Skin Assessment  Tattoos. Intact.      LDAs  Patient Lines/Drains/Airways Status    Active Lines, Drains and Airways     Name:   Placement date:   Placement time:   Site:   Days:    CPNB Catheter 05/28/17 Paravertebral - Thoracic Right  05/28/17    1500    Paravertebral - Thoracic    1    Peripheral IV 05/27/17 Right Forearm  05/27/17    0806    Forearm    2                  Psycho/Social:  CIWA - small tremor that can be felt fingertip to fingertip, some mild anxiousness      Problem: Pain  Goal: Pain at adequate level as identified by patient  Outcome: Progressing    onQ at 10 cchr.    Problem: Potential for Inadequate gas exchange  Goal: Adequate oxygenation and ventilation is maintained  Outcome: Progressing

## 2017-05-30 NOTE — Discharge Instructions (Signed)
Enoxaparin injection  Brand Name: Lovenox  What is this medicine?  ENOXAPARIN (ee nox a PA rin) is used after knee, hip, or abdominal surgeries to prevent blood clotting. It is also used to treat existing blood clots in the lungs or in the veins.  How should I use this medicine?  This medicine is for injection under the skin. It is usually given by a health-care professional. You or a family member may be trained on how to give the injections. If you are to give yourself injections, make sure you understand how to use the syringe, measure the dose if necessary, and give the injection. To avoid bruising, do not rub the site where this medicine has been injected. Do not take your medicine more often than directed. Do not stop taking except on the advice of your doctor or health care professional.  Make sure you receive a puncture-resistant container to dispose of the needles and syringes once you have finished with them. Do not reuse these items. Return the container to your doctor or health care professional for proper disposal.  Talk to your pediatrician regarding the use of this medicine in children. Special care may be needed.  What side effects may I notice from receiving this medicine?  Side effects that you should report to your doctor or health care professional as soon as possible:   allergic reactions like skin rash, itching or hives, swelling of the face, lips, or tongue   feeling faint or lightheaded, falls   signs and symptoms of bleeding such as bloody or black, tarry stools; red or dark-brown urine; spitting up blood or brown material that looks like coffee grounds; red spots on the skin; unusual bruising or bleeding from the eye, gums, or nose  Side effects that usually do not require medical attention (report to your doctor or health care professional if they continue or are bothersome):   pain, redness, or irritation at site where injected  What may interact with this medicine?   aspirin and  aspirin-like medicines   certain medicines that treat or prevent blood clots   dipyridamole   NSAIDs, medicines for pain and inflammation, like ibuprofen or naproxen  What if I miss a dose?  If you miss a dose, take it as soon as you can. If it is almost time for your next dose, take only that dose. Do not take double or extra doses.  Where should I keep my medicine?  Keep out of the reach of children.  Store at room temperature between 15 and 30 degrees C (59 and 86 degrees F). Do not freeze. If your injections have been specially prepared, you may need to store them in the refrigerator. Ask your pharmacist. Throw away any unused medicine after the expiration date.  What should I tell my health care provider before I take this medicine?  They need to know if you have any of these conditions:   bleeding disorders, hemorrhage, or hemophilia   infection of the heart or heart valves   kidney or liver disease   previous stroke   prosthetic heart valve   recent surgery or delivery of a baby   ulcer in the stomach or intestine, diverticulitis, or other bowel disease   an unusual or allergic reaction to enoxaparin, heparin, pork or pork products, other medicines, foods, dyes, or preservatives   pregnant or trying to get pregnant   breast-feeding  What should I watch for while using this medicine?  Visit your doctor   or health care professional for regular checks on your progress. Your condition will be monitored carefully while you are receiving this medicine.  Notify your doctor or health care professional and seek emergency treatment if you develop breathing problems; changes in vision; chest pain; severe, sudden headache; pain, swelling, warmth in the leg; trouble speaking; sudden numbness or weakness of the face, arm, or leg. These can be signs that your condition has gotten worse.  If you are going to have surgery, tell your doctor or health care professional that you are taking this medicine.  Do not stop  taking this medicine without first talking to your doctor. Be sure to refill your prescription before you run out of medicine.  Avoid sports and activities that might cause injury while you are using this medicine. Severe falls or injuries can cause unseen bleeding. Be careful when using sharp tools or knives. Consider using an electric razor. Take special care brushing or flossing your teeth. Report any injuries, bruising, or red spots on the skin to your doctor or health care professional.

## 2017-05-30 NOTE — Progress Notes (Signed)
[facility] ([facility] Inpatient Progress Note  ==================================================================    Date:  05/30/2017  Time: 4:10 PM      Patient Name: Stanley Kim, Stanley Kim  Surgeon: * No surgeons listed *    Visit Type:  Inpatient Room #   F337/F337.01    Surgical Procedure(s):   Post-op Day: 3 Days Post-Op    Continuous Peripheral Nerve Catheter Present?: Yes    Catheter #1  Catheter Location: Thoracic Paravertebral  Laterality: Right  Days Catheter In Situ:2  Infusion Medication and Rate: Ropivicaine 0.2% currently infusing at 10 mL/hr    Opioid Medication(s):       Oxycodone 5 mg  x  4 doses in 24 hours.    Adjuvant Analgesic Medication(s):       Gabapentin 300 mg PO q8hrs       Acetaminophen 1000 mg PO q8hrs       Lidoderm Patch   Ketorolac has been discontinued    24 Hour Vital Signs and Pain Score(s): Temp:  [36.2 C (97.2 F)-37.1 C (98.7 F)]   Heart Rate:  [76-97]   Resp Rate:  [16-34]   BP: (146-221)/(64-108)   SpO2:  [91 %-100 %]     Last recorded pain score:  Pain Scale Used: Numeric Scale (0-10)  Pain Score: 4-moderate pain       Subjective:  Symptoms Include:  Pt states he feels much better than yesterday,able to cough, deep breath, pulls 1000-1200 on IS    Objective: Pain score at rest:  3    Pain score with activity:  4                     Motor block:  No                     Sensory block:  Diminished                     Catheter site:   Clean, dry and intact.    Assessment:   Catheter is functioning as expected.    Plan:       - Continue local anesthetic infusion at current rate.        The patient may be discharged with the pain medicine infusing.    Would patient receive block again?  Yes    Level of Care: 2    ------------------------------------------------------------------------------------------------------------------  Ardith Dark  Department of Anesthesiology  [facility]  [facility]    [facility] General Phone (All  hours): [phone]  [facility] Pain Nurses (8am - 4pm):   Stacy Mooney-Cotter: [phone]   Theodosia Blender: [phone]          Anesthesiology Acute Pain Service Attending Note    Pain Service Progress Note and interval history reviewed:  agree    Patient seen and examined.    History:  mild    Physical exam:  awake and alert     Plan:  Continue    Level of Service: 2    Ardith Dark    05/30/2017  4:10 PM

## 2017-05-30 NOTE — Progress Notes (Signed)
[facility] ACUTE CARE SURGERY / TRAUMA PROGRESS NOTE     Date/Time: 05/30/17 6:56 AM  Patient Name: Stanley Kim  Primary Care Physician: Alinda Dooms, MD  Hospital Day: 4  Procedure(s):  INSERTION, PAIN PUMP (MEDICAL)   Post-op Day: 2 Days Post-Op    Assessment/Plan:   The patient has the following active problems:  Patient Active Problem List   Diagnosis   . Closed fracture of six ribs of right side   . Closed nondisplaced dome fracture of right acetabulum   . Acute pain due to trauma   . Closed displaced fracture of shaft of right clavicle   . Closed fracture of multiple ribs of right side, initial encounter       Plan by systems:  Neuro: Continue multimodal pain regimen including ATC tylenol, Lido patch, gabapentin, flexeril, prn Roxy 5/10     Seizure Prophylaxis not indicated  Pulm: Saturating well on RA, continue aggressive pulmonary toilet, IS use. Continue nebs and pap per RT  CV: HDS continue home medications including norvasc and cozaar, continue to monitor  Endo: NAI  GI: Regular diet, bowel regimen, encouraged mobilization  GI Prophylaxis:  No prophylaxis need, patient is on a diet   Heme/ID: afebrile, no abx at this time  DVT Prophylaxis: enoxaparin 30 BID  Renal: Voiding aUOP, monitor  Foley: no  Neuromuscular: R clavicle non op and R iliac wing non op, encourage mobilization with assistive devices as needed  Weight Bearing Right Left   Upper Extremity NWB WBAT   Lower Extremity WBAT WBAT   PT/OT: yes, rec AR  Psych: Supportive  Wounds: LWC prn  Dispo: PT/OT rec AR vs home w/supervision, DME. Patient refuses AR and wants to go home with wife and family assistance. D/C home today if able to ensure safe dispo and get appropriate DME. F2F done.     Orthopedics - [facility]    Interval History:   Stanley Kim is a 61 y.o. male who presents to the hospital after Fall: Yes.     Significant overnight events include: No acute events, still reports some pain with movement, but improved. Worked with PT/OT  yesterday. No BM yet, but refusing anything to help and states he'll go today    Allergies:   No Known Allergies    Medications:     Current Facility-Administered Medications   Medication Dose Route Frequency Provider Last Rate Last Dose   . acetaminophen (TYLENOL) tablet 1,000 mg  1,000 mg Oral [facility] Sherril Cong, MD   1,000 mg at 05/30/17 0533   . albuterol-ipratropium (DUO-NEB) 2.5-0.5(3) mg/3 mL nebulizer 3 mL  3 mL Nebulization Q4H [facility] Neita Carp, FNP   3 mL at 05/30/17 0414   . amLODIPine (NORVASC) tablet 5 mg  5 mg Oral Daily Sherril Cong, MD   5 mg at 05/29/17 0914   . calcium carbonate (TUMS) chewable tablet 500 mg  500 mg Oral 4X Daily PRN Pincus Badder, FNP   500 mg at 05/29/17 1102   . cyclobenzaprine (FLEXERIL) tablet 5 mg  5 mg Oral TID PRN Sherril Cong, MD   5 mg at 05/29/17 2325   . enoxaparin (LOVENOX) syringe 30 mg  30 mg Subcutaneous Q12H Neita Carp, FNP   30 mg at 05/29/17 2235   . famotidine (PEPCID) tablet 20 mg  20 mg Oral Q12H SCH Adela Glimpse, MD   20 mg at 05/29/17 2235   . gabapentin (NEURONTIN) capsule  300 mg  300 mg Oral [facility] Pincus Badder, FNP   300 mg at 05/30/17 0533   . lidocaine (LIDODERM) 5 % 1 patch  1 patch Transdermal Q24H Sherril Cong, MD   1 patch at 05/29/17 [phone]   . losartan (COZAAR) tablet 50 mg  50 mg Oral Daily Pincus Badder, FNP   50 mg at 05/29/17 0915   . naloxone [facility]) injection 0.2 mg  0.2 mg Intravenous PRN Sherril Cong, MD       . oxyCODONE (ROXICODONE) immediate release tablet 5 mg  5 mg Oral Q4H PRN Sherril Cong, MD   5 mg at 05/29/17 2235   . oxyCODONE (ROXICODONE) immediate release tablet 5 mg  5 mg Oral Q6H Neita Carp, FNP   5 mg at 05/30/17 0203   . polyethylene glycol (MIRALAX) packet 17 g  17 g Oral Daily Neita Carp, FNP   17 g at 05/29/17 9604   . ropivacaine (PF) (NAROPIN) 0.2 % 550 mL in On-Q pump single flow   Percutaneous Continuous Ardith Dark, MD 10 mL/hr at  05/28/17 1650     . senna-docusate (PERICOLACE) 8.6-50 MG per tablet 1 tablet  1 tablet Oral QHS Sherril Cong, MD   1 tablet at 05/29/17 2235       Labs:     Results     ** No results found for the last 24 hours. **          Rads:   Radiological Procedure reviewed.    Radiology Results (24 Hour)     ** No results found for the last 24 hours. **          Physical Exam:   Temp:  [97.6 F (36.4 C)-98.5 F (36.9 C)] 98.5 F (36.9 C)  Heart Rate:  [80-108] 85  Resp Rate:  [16-33] 21  BP: (133-187)/(59-108) 150/76      Vital Signs:  Vitals:    05/30/17 0600   BP: 150/76   Pulse: 85   Resp: 21   Temp:    SpO2: 95%       I/O:  Intake and Output Summary (Last 24 hours) at Date Time  I/O last 3 completed shifts:  In: 1620 [P.O.:1620]  Out: 1400 [Urine:1400]    Output:          Nutrition:   Orders Placed This Encounter   Procedures   . Diet regular Fluid restriction: OTHER (SEE COMMENTS) (Please give gatorade with meals)       Physical Exam:  Physical Exam   Constitutional: He is oriented to person, place, and time. He appears well-developed and well-nourished. No distress.   HENT:   Head: Normocephalic and atraumatic.   Eyes: Pupils are equal, round, and reactive to light. Conjunctivae and EOM are normal.   Neck: Normal range of motion. Neck supple. No tracheal deviation present.   Cardiovascular: Normal rate, regular rhythm, normal heart sounds and intact distal pulses.  Exam reveals no friction rub.    No murmur heard.  Pulmonary/Chest: Effort normal. No respiratory distress. He has no wheezes. He exhibits tenderness (over right shoulder).   Course breath sounds bilaterally. Saturating well on RA. IS ~1000. On-Q pump in place. Some bruising to left upper chest wall, no crepitus noted   Abdominal: Soft. Bowel sounds are normal. He exhibits distension. There is no tenderness.   Musculoskeletal: Normal range of motion. He exhibits no edema or deformity.   Neurological:  He is alert and oriented to person, place, and  time.   Skin: Skin is warm and dry.   Psychiatric: He has a normal mood and affect. His behavior is normal. Judgment and thought content normal.   Nursing note and vitals reviewed.      Willaim Bane, MD  General Surgery Resident, PGY-4    Attending Attestation:   This is a delayed attestation.  I saw and examined the patient at [phone]. My exam and findings duplicated as documented in the note above by Dr Sherilyn Cooter. I have personally edited and added to the text of this note to reflect my exam and medical assessment and plan.  I have personally reviewed the patient's history and 24 hour interval events, along with vitals, labs, radiology images and additional findings found in detail within the note above. I concur with the above documented assessment and care plan which was developed with and reviewed by me.     Larwance Sachs, MD, FACS  Acute Care Surgery

## 2017-05-30 NOTE — Progress Notes (Signed)
Pt IV d/c, spouse and dtr and bedside and also reviewed AVS; pharmacy doesn't take [employer] and unable to pay unless paid for out of pocket. Pt states he will take rx to [facility]. Family assisted with getting pt dressed and taken down via wheelchair with volunteer at this time. Pt family has wheelchair in car at this time.

## 2017-06-01 ENCOUNTER — Telehealth: Payer: Self-pay

## 2017-06-01 NOTE — Telephone Encounter (Signed)
Trauma patient called with questions about his pain pump and needs referral sent to his insurance company.  Please call him on his cell phone:[phone].    Zack Seal

## 2017-06-01 NOTE — Progress Notes (Signed)
SW received call from weekend covering CM Caryn Section). Patient was D/C home 12/8. However, covering CM received call from patient's family wanting him to go to a facility. Covering CM provided family with contact information to various liaisons.     SW spoke with [facility] liaison [place] who requested PT/OT notes for patient. Family requesting facility accept. Will need insurance auth.     Debby Bud, MSW  Social Worker II  [facility]  [phone]  on [place] call [phone]

## 2017-06-02 ENCOUNTER — Encounter: Payer: Self-pay | Admitting: Specialist

## 2017-06-03 NOTE — Telephone Encounter (Signed)
Called patient and left message regarding On Q pump. He was given the number for anesthesia and given the trauma clinic number to call if he any further questions.

## 2017-06-04 ENCOUNTER — Telehealth (INDEPENDENT_AMBULATORY_CARE_PROVIDER_SITE_OTHER): Payer: Self-pay

## 2017-06-04 ENCOUNTER — Telehealth: Payer: Self-pay | Admitting: Specialist

## 2017-06-04 NOTE — Telephone Encounter (Signed)
Attempted to contact patient unable to leave message will attempt to call back later

## 2017-06-04 NOTE — Telephone Encounter (Signed)
Pt's daughter called yesterday with questions regarding removal of her father's [facility].  Per family member, catheter has been empty for a couple of days.  Explained how to remove the catheter.    Called today for f/u - they have removed the catheter without difficulty.  Pt will f/u with Trauma service as needed.

## 2017-06-05 ENCOUNTER — Other Ambulatory Visit: Payer: Self-pay | Admitting: Family Nurse Practitioner

## 2017-06-05 ENCOUNTER — Ambulatory Visit: Payer: Medicare Other

## 2017-06-05 DIAGNOSIS — S2241XA Multiple fractures of ribs, right side, initial encounter for closed fracture: Secondary | ICD-10-CM

## 2017-06-08 ENCOUNTER — Encounter (INDEPENDENT_AMBULATORY_CARE_PROVIDER_SITE_OTHER): Payer: Self-pay

## 2017-06-08 ENCOUNTER — Ambulatory Visit (INDEPENDENT_AMBULATORY_CARE_PROVIDER_SITE_OTHER): Payer: Medicare Other | Admitting: Trauma Surgery

## 2017-06-08 ENCOUNTER — Encounter (INDEPENDENT_AMBULATORY_CARE_PROVIDER_SITE_OTHER): Payer: Self-pay | Admitting: Trauma Surgery

## 2017-06-08 VITALS — BP 157/73 | HR 71 | Ht 67.0 in | Wt 175.0 lb

## 2017-06-08 DIAGNOSIS — S2241XA Multiple fractures of ribs, right side, initial encounter for closed fracture: Secondary | ICD-10-CM

## 2017-06-08 MED ORDER — GABAPENTIN 400 MG PO CAPS
400.00 mg | ORAL_CAPSULE | Freq: Three times a day (TID) | ORAL | 1 refills | Status: AC
Start: 2017-06-08 — End: ?

## 2017-06-08 MED ORDER — OXYCODONE HCL 5 MG PO TABS
5.00 mg | ORAL_TABLET | ORAL | 0 refills | Status: AC | PRN
Start: 2017-06-08 — End: ?

## 2017-06-08 NOTE — Progress Notes (Addendum)
Trauma & Acute Care Surgery Attending      Pt with multiple right rib fxs, displaced. Right pleural effusion/hemothorax.  Had cxr today, brought disc. Shows small R effusion, lung looks okay otherwise.    He uses a wheelchair due to chronic hip and knee problems.  Some dyspnea at times. Using spirometer at home. Occasional cough.    Lungs with decreased BS at right base. Still has extensive ecchymosis over lower back, right chest; tender there    I will increase his gabapentin from 300 to 400mg  TID  Gave another 30 tabs of oxycodone 5mg     F/U with [place] in 3-4 weeks    CXR from an outside facility below:          C. Hewitt Shorts, MD, FACS, [facility]

## 2017-06-08 NOTE — Patient Instructions (Addendum)
You were seen in the [facility] today by Dr. Hewitt Shorts.     Your rib fractures will take another month or so to heal.  Your chest xray shows some fluid in the chest on the right. This should absorb over time.    For pain, continue gabapentin. I have increased the dose to 400mg  every 8 hours.  Take tylenol as well, and use a heating pad.  Try to decrease use of oxycodone over the next two weeks.  See rib fracture instructions below.    For questions or problems, or to make a clinic appointment, call:  Trauma & Acute Care Surgery   [facility]  [phone]    For radiology test scheduling you may call either:  [facility], [phone]  [facility], [phone]  _____________________________________________________________________    RIB FRACTURES:    Rib fractures are a common injury after falls, motor vehicle crashes, or other kinds of trauma. Broken, cracked, fractured - these are all the same thing. Rib fractures hurt. If you have them, you know it. It may hurt when you move, twist, or take a deep breath.    There are 12 ribs on each side of the chest. Usually the more ribs that are broken, the worse the pain. In addition to pain, broken ribs can lead to complications because they prevent people from moving around and breathing properly. When you can't walk or take a deep breath, mucus can collect in your lungs, or your lung sacs that are normally filled with air can close up. This can lead to more trouble breathing, low oxygen levels, or even pneumonia. Usually, the more fractured ribs you have, the higher your chance of having complications. Also, complications increase the older you are.    The primary goal of rib fracture care is pain control, so that you can do the breathing exercises and walking that will keep your lungs healthy. The pain may last anywhere from a few days to weeks, and in some cases a few months. In general, the more displaced the ribs (that  is, the more out of place the two broken ends are), the longer the recovery. Broken ribs usually fuse together and heal in 6 to 8 weeks; shorter if your bones are healthy, longer if your bones are weak or you are elderly. In some cases, if the ribs are very displaced or the pain is not improving, surgery may be an option to fix the ribs back in place. Discuss this with the trauma surgeon.    When you go home from the hospital, you need to stay active to prevent complications. Here are some tips to help you stay as comfortable as possible:    - Use multiple kinds of pain medicine. These will be prescribed by your doctor, but may include some of these medicines:   Acetaminophen (Tylenol) - not that strong but helps give some background pain   control   Ibuprofen (Advil, motrin) or naroxen (Alleve) - these are anti-inflammatory drugs,   and are often the most effective drugs for this condition. Take with food to   avoid stomach upset, and don't use a lot if you have decreased kidney   function or are elderly.   Gabapentin (Neurontin) - helps with types of nerve pain, usually very effective    Opioids/narcotics - can help severe pain, but have potential for complications   such as constipation, decreased appetite, nausea, lethargy, or addiction.   These  are  often necessary in the first few weeks after injury, but should   be weaned off as soon as tolerable.   Pain patches - lidocaine (Lidoderm) patches may help, but are expensive. Over   the counter patches such as Chinita Pester are cheaper and often work well.   Muscle relaxants - help with muscle spasm or stiff muscles    - Use a heating pad to loosen stiff muscles and increase blood flow in the muscles around the ribs. Especially helpful first thing in the morning when muscles are cooled down and stiff. Stick-on heat therapy pads are available over the counter also, and can be worn at night while sleeping, or during the day.  - Hot showers may also help to loosen  muscles.  - Sleep in a recliner. Lying flat stretches the ribs apart, and may be difficult. You may find a recliner more comfortable until you can lie flat.  - Don't sit around, stay active. You don't need to exercise, but if you don't walk and use your arms your chest will become stiff and more painful.   - Try standing next to a wall, about an arm's length away, and walk your fingers up the wall as high as you can go over your head. Hold for ten seconds, then walk them down. Do this a few times on the injured side (or sides) each day, to keep your muscles loose and stretched. You may not go to high at first, but eventually should be able to raise your arms straight up over your head. Stop if it hurts too much.   - Binding or splinting the ribs with wraps is not recommended. This can prevent you from breathing properly. Your chest needs to be able to expand to keep your lungs healthy.  - Physical therapy may be prescribed if you are not improving after discharge from the hospital, or are not as mobile as you should be.    Most people with rib fractures are not able to work for one or several weeks. Discuss timing of return to work with your trauma surgeon. If you need a note, we can provide one.    In a small percentage of people, rib fracture pain becomes long term or chronic. This may be due to nerve injury or non-healing ribs. If you are not able to be weaned from narcotic/opioid pain medications in a reasonable time period, you may be referred to a pain specialist physician to manage your pain over a longer time period.

## 2017-06-17 ENCOUNTER — Encounter: Payer: Self-pay | Admitting: Family Nurse Practitioner

## 2017-06-17 ENCOUNTER — Telehealth (INDEPENDENT_AMBULATORY_CARE_PROVIDER_SITE_OTHER): Payer: Self-pay

## 2017-06-17 NOTE — Progress Notes (Signed)
Called patient back. Encouraged use of ibuprofen and Tylenol. Wrote an rx for 4 more days of oxycodone and 7 more days of Flexeril     Oxy 5 mg Q6- dispense 20  Flexeril 5 mg TID- Dispense 21     Pt aware he will have to pick up at nurses station

## 2017-09-14 ENCOUNTER — Encounter (INDEPENDENT_AMBULATORY_CARE_PROVIDER_SITE_OTHER): Payer: Self-pay

## 2017-09-14 NOTE — Progress Notes (Signed)
Spoke with subject on the phone to complete his Prevent Clot Study 3 month follow-up visit. Subject has moved out of the area and therefore unable to be seen in clinic for an in-person visit. All study related questionnaires were completed.  Coordinator explained this completes the subject's participation in the study. Subject was thanked for his participation.    The following research procedures were completed per protocol:  1. Research surveys: Not a billable item  Study Cost center: [phone]    Benjie Karvonen, RN, BSN, [facility] [place], Phone [phone]

## 2017-11-12 NOTE — H&P (Signed)
TOTAL HIP ADMISSION H&P  Patient is admitted for left total hip arthroplasty, anterior approach.  Subjective:  Chief Complaint: Left hip primary OA / pain  HPI: James Mendez, 62 y.o. male, has a history of pain and functional disability in the left hip(s) due to arthritis and patient has failed non-surgical conservative treatments for greater than 12 weeks to include NSAID's and/or analgesics, corticosteriod injections, use of assistive devices and activity modification.  Onset of symptoms was gradual starting 5+ years ago with gradually worsening course since that time.The patient noted no past surgery on the left hip(s).  Patient currently rates pain in the left hip at 10 out of 10 with activity. Patient has night pain, worsening of pain with activity and weight bearing, trendelenberg gait, pain that interfers with activities of daily living and pain with passive range of motion. Patient has evidence of periarticular osteophytes and joint space narrowing by imaging studies. This condition presents safety issues increasing the risk of falls.  There is no current active infection.  Risks, benefits and expectations were discussed with the patient.  Risks including but not limited to the risk of anesthesia, blood clots, nerve damage, blood vessel damage, failure of the prosthesis, infection and up to and including death.  Patient understand the risks, benefits and expectations and wishes to proceed with surgery.   PCP: System, Pcp Not In  D/C Plans:       Home  Post-op Meds:       No Rx given  Tranexamic Acid:      To be given - IV   Decadron:      Is to be given  FYI:      ASA  Norco  DME:   Pt already has equipment   PT:    No PT    Past Medical History:  Diagnosis Date  . Arthritis   . GERD (gastroesophageal reflux disease)   . Hypertension    controlled with meds    Past Surgical History:  Procedure Laterality Date  . EYE SURGERY     cataract bil  . JOINT REPLACEMENT      11-24-17 Dr. Charlann Boxer 11-24-17    Current Facility-Administered Medications  Medication Dose Route Frequency Provider Last Rate Last Dose  . [START ON 11/24/2017] tranexamic acid (CYKLOKAPRON) 1,000 mg in sodium chloride 0.9 % 100 mL IVPB  1,000 mg Intravenous On Call to OR Danford Bad, [facility]       Current Outpatient Medications  Medication Sig Dispense Refill Last Dose  . amLODipine (NORVASC) 5 MG tablet Take 5 mg by mouth daily.     . Diclofenac Sodium CR 100 MG 24 hr tablet Take 100 mg by mouth daily.     . finasteride (PROSCAR) 5 MG tablet Take 5 mg by mouth daily.     Marland Kitchen gabapentin (NEURONTIN) 100 MG capsule Take 100 mg by mouth 3 (three) times daily.     . Lidocaine 5 % CREA Apply 1 application topically daily as needed (pain).     Marland Kitchen losartan (COZAAR) 50 MG tablet Take 50 mg by mouth daily.     . metoprolol tartrate (LOPRESSOR) 25 MG tablet Take 25 mg by mouth 2 (two) times daily.     . nicotine (NICODERM CQ - DOSED IN MG/24 HOURS) 21 mg/24hr patch Place 21 mg onto the skin daily.     . pantoprazole (PROTONIX) 40 MG tablet Take 40 mg by mouth daily.     . tamsulosin (FLOMAX) 0.4  MG CAPS capsule Take 0.4 mg by mouth at bedtime.     . traMADol (ULTRAM) 50 MG tablet Take by mouth 3 (three) times daily.      Allergies  Allergen Reactions  . Adhesive [Tape] Rash     Social History   Tobacco Use  . Smoking status: Current Every Day Smoker    Packs/day: 1.00    Types: Cigarettes  . Smokeless tobacco: Never Used  Substance Use Topics  . Alcohol use: Yes    Alcohol/week: 3.6 oz    Types: 6 Cans of beer per week    Comment: daily        Review of Systems  Constitutional: Negative.   HENT: Negative.   Eyes: Negative.   Respiratory: Negative.   Cardiovascular: Negative.   Gastrointestinal: Positive for heartburn.  Genitourinary: Negative.   Musculoskeletal: Positive for joint pain.  Skin: Negative.   Neurological: Negative.   Endo/Heme/Allergies: Negative.    Psychiatric/Behavioral: Negative.     Objective:  Physical Exam  Constitutional: He is oriented to person, place, and time. He appears well-developed.  HENT:  Head: Normocephalic.  Eyes: Pupils are equal, round, and reactive to light.  Neck: Neck supple. No JVD present. No tracheal deviation present. No thyromegaly present.  Cardiovascular: Normal rate, regular rhythm and intact distal pulses.  Respiratory: Effort normal and breath sounds normal. No respiratory distress. He has no wheezes.  GI: Soft. There is no tenderness. There is no guarding.  Musculoskeletal:       Left hip: He exhibits decreased range of motion, decreased strength, tenderness and bony tenderness. He exhibits no swelling, no deformity and no laceration.  Lymphadenopathy:    He has no cervical adenopathy.  Neurological: He is alert and oriented to person, place, and time.  Skin: Skin is warm and dry.  Psychiatric: He has a normal mood and affect.      Imaging Review Plain radiographs demonstrate severe degenerative joint disease of the left hip(s). The bone quality appears to be good for age and reported activity level.    Preoperative templating of the joint replacement has been completed, documented, and submitted to the Operating Room personnel in order to optimize intra-operative equipment management.     Assessment/Plan:  End stage arthritis, left hip(s)  The patient history, physical examination, clinical judgement of the provider and imaging studies are consistent with end stage degenerative joint disease of the left hip(s) and total hip arthroplasty is deemed medically necessary. The treatment options including medical management, injection therapy, arthroscopy and arthroplasty were discussed at length. The risks and benefits of total hip arthroplasty were presented and reviewed. The risks due to aseptic loosening, infection, stiffness, dislocation/subluxation,  thromboembolic complications and  other imponderables were discussed.  The patient acknowledged the explanation, agreed to proceed with the plan and consent was signed. Patient is being admitted for inpatient treatment for surgery, pain control, PT, OT, prophylactic antibiotics, VTE prophylaxis, progressive ambulation and ADL's and discharge planning.The patient is planning to be discharged home.      Anastasio Auerbach Taneia Mealor   PA-C  11/23/2017, 2:36 PM

## 2017-11-19 NOTE — Patient Instructions (Addendum)
James Mendez  11/19/2017   Your procedure is scheduled on: 11-24-17  Report to [facility] Main  Entrance               Report to admitting at       0830 AM    Call this number if you have problems the morning of surgery [phone]   Remember: Do not eat food or drink liquids :After Midnight.     Take these medicines the morning of surgery with A SIP OF WATER: amlodipine, finasteride, metoprolol, protonix, tamulosin                                You may not have any metal on your body including hair pins and              piercings  Do not wear jewelry, , lotions, powders or perfumes, deodorant                     Men may shave face and neck.   Do not bring valuables to the hospital. [facility] IS NOT             RESPONSIBLE   FOR VALUABLES.  Contacts, dentures or bridgework may not be worn into surgery.  Leave suitcase in the car. After surgery it may be brought to your room.               Please read over the following fact sheets you were given: _____________________________________________________________________         [facility] - Preparing for Surgery Before surgery, you can play an important role.  Because skin is not sterile, your skin needs to be as free of germs as possible.  You can reduce the number of germs on your skin by washing with CHG (chlorahexidine gluconate) soap before surgery.  CHG is an antiseptic cleaner which kills germs and bonds with the skin to continue killing germs even after washing. Please DO NOT use if you have an allergy to CHG or antibacterial soaps.  If your skin becomes reddened/irritated stop using the CHG and inform your nurse when you arrive at Short Stay. Do not shave (including legs and underarms) for at least 48 hours prior to the first CHG shower.  You may shave your face/neck. Please follow these instructions carefully:  1.  Shower with CHG Soap the night before surgery and the  morning of Surgery.  2.  If you  choose to wash your hair, wash your hair first as usual with your  normal  shampoo.  3.  After you shampoo, rinse your hair and body thoroughly to remove the  shampoo.                           4.  Use CHG as you would any other liquid soap.  You can apply chg directly  to the skin and wash                       Gently with a scrungie or clean washcloth.  5.  Apply the CHG Soap to your body ONLY FROM THE NECK DOWN.   Do not use on face/ open  Wound or open sores. Avoid contact with eyes, ears mouth and genitals (private parts).                       Wash face,  Genitals (private parts) with your normal soap.             6.  Wash thoroughly, paying special attention to the area where your surgery  will be performed.  7.  Thoroughly rinse your body with warm water from the neck down.  8.  DO NOT shower/wash with your normal soap after using and rinsing off  the CHG Soap.                9.  Pat yourself dry with a clean towel.            10.  Wear clean pajamas.            11.  Place clean sheets on your bed the night of your first shower and do not  sleep with pets. Day of Surgery : Do not apply any lotions/deodorants the morning of surgery.  Please wear clean clothes to the hospital/surgery center.  FAILURE TO FOLLOW THESE INSTRUCTIONS MAY RESULT IN THE CANCELLATION OF YOUR SURGERY PATIENT SIGNATURE_________________________________  NURSE SIGNATURE__________________________________  ________________________________________________________________________  WHAT IS A BLOOD TRANSFUSION? Blood Transfusion Information  A transfusion is the replacement of blood or some of its parts. Blood is made up of multiple cells which provide different functions.  Red blood cells carry oxygen and are used for blood loss replacement.  White blood cells fight against infection.  Platelets control bleeding.  Plasma helps clot blood.  Other blood products are available for  specialized needs, such as hemophilia or other clotting disorders. BEFORE THE TRANSFUSION  Who gives blood for transfusions?   Healthy volunteers who are fully evaluated to make sure their blood is safe. This is blood bank blood. Transfusion therapy is the safest it has ever been in the practice of medicine. Before blood is taken from a donor, a complete history is taken to make sure that person has no history of diseases nor engages in risky social behavior (examples are intravenous drug use or sexual activity with multiple partners). The donor's travel history is screened to minimize risk of transmitting infections, such as malaria. The donated blood is tested for signs of infectious diseases, such as HIV and hepatitis. The blood is then tested to be sure it is compatible with you in order to minimize the chance of a transfusion reaction. If you or a relative donates blood, this is often done in anticipation of surgery and is not appropriate for emergency situations. It takes many days to process the donated blood. RISKS AND COMPLICATIONS Although transfusion therapy is very safe and saves many lives, the main dangers of transfusion include:   Getting an infectious disease.  Developing a transfusion reaction. This is an allergic reaction to something in the blood you were given. Every precaution is taken to prevent this. The decision to have a blood transfusion has been considered carefully by your caregiver before blood is given. Blood is not given unless the benefits outweigh the risks. AFTER THE TRANSFUSION  Right after receiving a blood transfusion, you will usually feel much better and more energetic. This is especially true if your red blood cells have gotten low (anemic). The transfusion raises the level of the red blood cells which carry oxygen, and this usually causes an energy increase.  The  nurse administering the transfusion will monitor you carefully for complications. HOME CARE  INSTRUCTIONS  No special instructions are needed after a transfusion. You may find your energy is better. Speak with your caregiver about any limitations on activity for underlying diseases you may have. SEEK MEDICAL CARE IF:   Your condition is not improving after your transfusion.  You develop redness or irritation at the intravenous (IV) site. SEEK IMMEDIATE MEDICAL CARE IF:  Any of the following symptoms occur over the next 12 hours:  Shaking chills.  You have a temperature by mouth above 102 F (38.9 C), not controlled by medicine.  Chest, back, or muscle pain.  People around you feel you are not acting correctly or are confused.  Shortness of breath or difficulty breathing.  Dizziness and fainting.  You get a rash or develop hives.  You have a decrease in urine output.  Your urine turns a dark color or changes to pink, red, or brown. Any of the following symptoms occur over the next 10 days:  You have a temperature by mouth above 102 F (38.9 C), not controlled by medicine.  Shortness of breath.  Weakness after normal activity.  The white part of the eye turns yellow (jaundice).  You have a decrease in the amount of urine or are urinating less often.  Your urine turns a dark color or changes to pink, red, or brown. Document Released: 06/06/2000 Document Revised: 09/01/2011 Document Reviewed: 01/24/2008 Exit[employer] Patient Information 2014 [place].  _______________________________________________________________________  Incentive Spirometer  An incentive spirometer is a tool that can help keep your lungs clear and active. This tool measures how well you are filling your lungs with each breath. Taking long deep breaths may help reverse or decrease the chance of developing breathing (pulmonary) problems (especially infection) following:  A long period of time when you are unable to move or be active. BEFORE THE PROCEDURE   If the spirometer includes an  indicator to show your best effort, your nurse or respiratory therapist will set it to a desired goal.  If possible, sit up straight or lean slightly forward. Try not to slouch.  Hold the incentive spirometer in an upright position. INSTRUCTIONS FOR USE  1. Sit on the edge of your bed if possible, or sit up as far as you can in bed or on a chair. 2. Hold the incentive spirometer in an upright position. 3. Breathe out normally. 4. Place the mouthpiece in your mouth and seal your lips tightly around it. 5. Breathe in slowly and as deeply as possible, raising the piston or the ball toward the top of the column. 6. Hold your breath for 3-5 seconds or for as long as possible. Allow the piston or ball to fall to the bottom of the column. 7. Remove the mouthpiece from your mouth and breathe out normally. 8. Rest for a few seconds and repeat Steps 1 through 7 at least 10 times every 1-2 hours when you are awake. Take your time and take a few normal breaths between deep breaths. 9. The spirometer may include an indicator to show your best effort. Use the indicator as a goal to work toward during each repetition. 10. After each set of 10 deep breaths, practice coughing to be sure your lungs are clear. If you have an incision (the cut made at the time of surgery), support your incision when coughing by placing a pillow or rolled up towels firmly against it. Once you are able to get  out of bed, walk around indoors and cough well. You may stop using the incentive spirometer when instructed by your caregiver.  RISKS AND COMPLICATIONS  Take your time so you do not get dizzy or light-headed.  If you are in pain, you may need to take or ask for pain medication before doing incentive spirometry. It is harder to take a deep breath if you are having pain. AFTER USE  Rest and breathe slowly and easily.  It can be helpful to keep track of a log of your progress. Your caregiver can provide you with a simple table  to help with this. If you are using the spirometer at home, follow these instructions: [place] IF:   You are having difficultly using the spirometer.  You have trouble using the spirometer as often as instructed.  Your pain medication is not giving enough relief while using the spirometer.  You develop fever of 100.5 F (38.1 C) or higher. SEEK IMMEDIATE MEDICAL CARE IF:   You cough up bloody sputum that had not been present before.  You develop fever of 102 F (38.9 C) or greater.  You develop worsening pain at or near the incision site. MAKE SURE YOU:   Understand these instructions.  Will watch your condition.  Will get help right away if you are not doing well or get worse. Document Released: 10/20/2006 Document Revised: 09/01/2011 Document Reviewed: 12/21/2006 [facility] Patient Information 2014 [place], [place].   ________________________________________________________________________

## 2017-11-20 ENCOUNTER — Encounter (HOSPITAL_COMMUNITY)
Admission: RE | Admit: 2017-11-20 | Discharge: 2017-11-20 | Disposition: A | Discharge status: Home / Self Care | Payer: Medicare HMO | Source: Ambulatory Visit | Attending: Orthopedic Surgery | Admitting: Orthopedic Surgery

## 2017-11-20 ENCOUNTER — Other Ambulatory Visit: Payer: Self-pay

## 2017-11-20 ENCOUNTER — Encounter (HOSPITAL_COMMUNITY): Payer: Self-pay

## 2017-11-20 DIAGNOSIS — M1612 Unilateral primary osteoarthritis, left hip: Secondary | ICD-10-CM | POA: Diagnosis not present

## 2017-11-20 DIAGNOSIS — Z01812 Encounter for preprocedural laboratory examination: Secondary | ICD-10-CM | POA: Diagnosis not present

## 2017-11-20 DIAGNOSIS — Z01810 Encounter for preprocedural cardiovascular examination: Secondary | ICD-10-CM | POA: Diagnosis not present

## 2017-11-20 DIAGNOSIS — M25552 Pain in left hip: Secondary | ICD-10-CM | POA: Insufficient documentation

## 2017-11-20 HISTORY — DX: Essential (primary) hypertension: I10

## 2017-11-20 HISTORY — DX: Gastro-esophageal reflux disease without esophagitis: K21.9

## 2017-11-20 LAB — CBC
HEMATOCRIT: 38.8 % — AB (ref 39.0–52.0)
Hemoglobin: 13.1 g/dL (ref 13.0–17.0)
MCH: 32.8 pg (ref 26.0–34.0)
MCHC: 33.8 g/dL (ref 30.0–36.0)
MCV: 97 fL (ref 78.0–100.0)
PLATELETS: 286 10*3/uL (ref 150–400)
RBC: 4 MIL/uL — ABNORMAL LOW (ref 4.22–5.81)
RDW: 12.8 % (ref 11.5–15.5)
WBC: 7.2 10*3/uL (ref 4.0–10.5)

## 2017-11-20 LAB — BASIC METABOLIC PANEL
Anion gap: 9 (ref 5–15)
BUN: 13 mg/dL (ref 6–20)
CALCIUM: 9.1 mg/dL (ref 8.9–10.3)
CO2: 25 mmol/L (ref 22–32)
CREATININE: 0.75 mg/dL (ref 0.61–1.24)
Chloride: 105 mmol/L (ref 101–111)
GFR calc Af Amer: >60 mL/min (ref >60)
GLUCOSE: 93 mg/dL (ref 65–99)
Potassium: 3.8 mmol/L (ref 3.5–5.1)
Sodium: 139 mmol/L (ref 135–145)

## 2017-11-20 LAB — SURGICAL PCR SCREEN
MRSA, PCR: NEGATIVE
Staphylococcus aureus: NEGATIVE

## 2017-11-21 LAB — ABO/RH: ABO/RH(D): A POS

## 2017-11-23 MED ORDER — TRANEXAMIC ACID 1000 MG/10ML IV SOLN
1000.0000 mg | INTRAVENOUS | Status: AC
  Administered 2017-11-24: 1000 mg via INTRAVENOUS
  Filled 2017-11-23: qty 1100

## 2017-11-24 ENCOUNTER — Inpatient Hospital Stay (HOSPITAL_COMMUNITY)
Admission: RE | Admit: 2017-11-24 | Discharge: 2017-11-25 | DRG: 470 | Disposition: A | Discharge status: Home / Self Care | Payer: Medicare HMO | Source: Ambulatory Visit | Attending: Orthopedic Surgery | Admitting: Orthopedic Surgery

## 2017-11-24 ENCOUNTER — Other Ambulatory Visit: Payer: Self-pay

## 2017-11-24 ENCOUNTER — Encounter (HOSPITAL_COMMUNITY)
Admission: RE | Disposition: A | Discharge status: Home / Self Care | Payer: Self-pay | Source: Ambulatory Visit | Attending: Orthopedic Surgery

## 2017-11-24 ENCOUNTER — Inpatient Hospital Stay (HOSPITAL_COMMUNITY): Payer: Medicare HMO | Admitting: Certified Registered"

## 2017-11-24 ENCOUNTER — Inpatient Hospital Stay (HOSPITAL_COMMUNITY): Payer: Medicare HMO

## 2017-11-24 ENCOUNTER — Encounter (HOSPITAL_COMMUNITY): Payer: Self-pay | Admitting: Emergency Medicine

## 2017-11-24 DIAGNOSIS — I10 Essential (primary) hypertension: Secondary | ICD-10-CM | POA: Diagnosis present

## 2017-11-24 DIAGNOSIS — E669 Obesity, unspecified: Secondary | ICD-10-CM | POA: Diagnosis present

## 2017-11-24 DIAGNOSIS — M1612 Unilateral primary osteoarthritis, left hip: Secondary | ICD-10-CM | POA: Diagnosis present

## 2017-11-24 DIAGNOSIS — Z79899 Other long term (current) drug therapy: Secondary | ICD-10-CM

## 2017-11-24 DIAGNOSIS — Z6830 Body mass index (BMI) 30.0-30.9, adult: Secondary | ICD-10-CM

## 2017-11-24 DIAGNOSIS — K219 Gastro-esophageal reflux disease without esophagitis: Secondary | ICD-10-CM | POA: Diagnosis present

## 2017-11-24 DIAGNOSIS — Z96649 Presence of unspecified artificial hip joint: Secondary | ICD-10-CM

## 2017-11-24 DIAGNOSIS — Z96642 Presence of left artificial hip joint: Secondary | ICD-10-CM

## 2017-11-24 HISTORY — PX: TOTAL HIP ARTHROPLASTY: SHX124

## 2017-11-24 LAB — TYPE AND SCREEN
ABO/RH(D): A POS
ANTIBODY SCREEN: NEGATIVE

## 2017-11-24 SURGERY — ARTHROPLASTY, HIP, TOTAL, ANTERIOR APPROACH
Anesthesia: Spinal | Site: Hip | Laterality: Left

## 2017-11-24 MED ORDER — ONDANSETRON HCL 4 MG/2ML IJ SOLN: 4.0000 mg | Freq: Four times a day (QID) | INTRAMUSCULAR | Status: DC | PRN

## 2017-11-24 MED ORDER — FINASTERIDE 5 MG PO TABS
5.0000 mg | ORAL_TABLET | Freq: Every day | ORAL | Status: DC
  Administered 2017-11-25: 5 mg via ORAL
  Filled 2017-11-24: qty 1

## 2017-11-24 MED ORDER — METHOCARBAMOL 1000 MG/10ML IJ SOLN
500.0000 mg | Freq: Four times a day (QID) | INTRAVENOUS | Status: DC | PRN
  Administered 2017-11-24: 500 mg via INTRAVENOUS
  Filled 2017-11-24: qty 550

## 2017-11-24 MED ORDER — MEPERIDINE HCL 50 MG/ML IJ SOLN: 6.2500 mg | INTRAMUSCULAR | Status: DC | PRN

## 2017-11-24 MED ORDER — BUPIVACAINE IN DEXTROSE 0.75-8.25 % IT SOLN
INTRATHECAL | Status: DC | PRN
  Administered 2017-11-24: 2 mL via INTRATHECAL

## 2017-11-24 MED ORDER — HYDROCODONE-ACETAMINOPHEN 7.5-325 MG PO TABS: 1.0000 | ORAL_TABLET | ORAL | 0 refills | Status: DC | PRN

## 2017-11-24 MED ORDER — CELECOXIB 200 MG PO CAPS
200.0000 mg | ORAL_CAPSULE | Freq: Two times a day (BID) | ORAL | Status: DC
  Administered 2017-11-24 – 2017-11-25 (×2): 200 mg via ORAL
  Filled 2017-11-24 (×2): qty 1

## 2017-11-24 MED ORDER — HYDROCODONE-ACETAMINOPHEN 5-325 MG PO TABS
1.0000 | ORAL_TABLET | ORAL | Status: DC | PRN
  Administered 2017-11-24: 2 via ORAL

## 2017-11-24 MED ORDER — EPHEDRINE SULFATE-NACL 50-0.9 MG/10ML-% IV SOSY
PREFILLED_SYRINGE | INTRAVENOUS | Status: DC | PRN
  Administered 2017-11-24 (×2): 10 mg via INTRAVENOUS

## 2017-11-24 MED ORDER — CEFAZOLIN SODIUM-DEXTROSE 2-4 GM/100ML-% IV SOLN
2.0000 g | Freq: Four times a day (QID) | INTRAVENOUS | Status: AC
  Administered 2017-11-24 – 2017-11-25 (×2): 2 g via INTRAVENOUS
  Filled 2017-11-24 (×2): qty 100

## 2017-11-24 MED ORDER — LOSARTAN POTASSIUM 50 MG PO TABS
50.0000 mg | ORAL_TABLET | Freq: Every day | ORAL | Status: DC
  Administered 2017-11-25: 50 mg via ORAL
  Filled 2017-11-24 (×2): qty 1

## 2017-11-24 MED ORDER — LACTATED RINGERS IV SOLN: INTRAVENOUS | Status: DC

## 2017-11-24 MED ORDER — POLYETHYLENE GLYCOL 3350 17 G PO PACK: 17.0000 g | PACK | Freq: Two times a day (BID) | ORAL | 0 refills | Status: DC

## 2017-11-24 MED ORDER — FENTANYL CITRATE (PF) 100 MCG/2ML IJ SOLN: 25.0000 ug | INTRAMUSCULAR | Status: DC | PRN

## 2017-11-24 MED ORDER — DEXAMETHASONE SODIUM PHOSPHATE 10 MG/ML IJ SOLN
10.0000 mg | Freq: Once | INTRAMUSCULAR | Status: AC
  Administered 2017-11-25: 10 mg via INTRAVENOUS
  Filled 2017-11-24: qty 1

## 2017-11-24 MED ORDER — MAGNESIUM CITRATE PO SOLN: 1.0000 | Freq: Once | ORAL | Status: DC | PRN

## 2017-11-24 MED ORDER — DEXAMETHASONE SODIUM PHOSPHATE 10 MG/ML IJ SOLN
INTRAMUSCULAR | Status: AC
  Filled 2017-11-24: qty 1

## 2017-11-24 MED ORDER — FERROUS SULFATE 325 (65 FE) MG PO TABS
325.0000 mg | ORAL_TABLET | Freq: Three times a day (TID) | ORAL | Status: DC
  Administered 2017-11-25: 325 mg via ORAL
  Filled 2017-11-24: qty 1

## 2017-11-24 MED ORDER — PHENOL 1.4 % MT LIQD: 1.0000 | OROMUCOSAL | Status: DC | PRN

## 2017-11-24 MED ORDER — FENTANYL CITRATE (PF) 100 MCG/2ML IJ SOLN
INTRAMUSCULAR | Status: AC
  Filled 2017-11-24: qty 2

## 2017-11-24 MED ORDER — GABAPENTIN 100 MG PO CAPS
100.0000 mg | ORAL_CAPSULE | Freq: Three times a day (TID) | ORAL | Status: DC
  Administered 2017-11-24 – 2017-11-25 (×2): 100 mg via ORAL
  Filled 2017-11-24 (×2): qty 1

## 2017-11-24 MED ORDER — ASPIRIN 81 MG PO CHEW: 81.0000 mg | CHEWABLE_TABLET | Freq: Two times a day (BID) | ORAL | 0 refills | Status: AC

## 2017-11-24 MED ORDER — MIDAZOLAM HCL 5 MG/5ML IJ SOLN
INTRAMUSCULAR | Status: DC | PRN
  Administered 2017-11-24: 2 mg via INTRAVENOUS

## 2017-11-24 MED ORDER — PANTOPRAZOLE SODIUM 40 MG PO TBEC
40.0000 mg | DELAYED_RELEASE_TABLET | Freq: Every day | ORAL | Status: DC
  Administered 2017-11-25: 40 mg via ORAL
  Filled 2017-11-24: qty 1

## 2017-11-24 MED ORDER — FERROUS SULFATE 325 (65 FE) MG PO TABS: 325.0000 mg | ORAL_TABLET | Freq: Three times a day (TID) | ORAL | 3 refills | Status: DC

## 2017-11-24 MED ORDER — PROPOFOL 10 MG/ML IV BOLUS
INTRAVENOUS | Status: AC
  Filled 2017-11-24: qty 20

## 2017-11-24 MED ORDER — SODIUM CHLORIDE 0.9 % IR SOLN
Status: DC | PRN
  Administered 2017-11-24: 1000 mL

## 2017-11-24 MED ORDER — ASPIRIN 81 MG PO CHEW
81.0000 mg | CHEWABLE_TABLET | Freq: Two times a day (BID) | ORAL | Status: DC
  Administered 2017-11-25: 81 mg via ORAL
  Filled 2017-11-24: qty 1

## 2017-11-24 MED ORDER — TRANEXAMIC ACID 1000 MG/10ML IV SOLN
1000.0000 mg | Freq: Once | INTRAVENOUS | Status: AC
  Administered 2017-11-24: 1000 mg via INTRAVENOUS
  Filled 2017-11-24: qty 1100

## 2017-11-24 MED ORDER — PHENYLEPHRINE 40 MCG/ML (10ML) SYRINGE FOR IV PUSH (FOR BLOOD PRESSURE SUPPORT)
PREFILLED_SYRINGE | INTRAVENOUS | Status: DC | PRN
  Administered 2017-11-24 (×4): 80 ug via INTRAVENOUS

## 2017-11-24 MED ORDER — METHOCARBAMOL 500 MG PO TABS: 500.0000 mg | ORAL_TABLET | Freq: Four times a day (QID) | ORAL | 0 refills | Status: DC | PRN

## 2017-11-24 MED ORDER — HYDROCODONE-ACETAMINOPHEN 7.5-325 MG PO TABS
1.0000 | ORAL_TABLET | ORAL | Status: DC | PRN
  Administered 2017-11-24 – 2017-11-25 (×5): 2 via ORAL
  Filled 2017-11-24 (×6): qty 2

## 2017-11-24 MED ORDER — METOCLOPRAMIDE HCL 5 MG/ML IJ SOLN: 5.0000 mg | Freq: Three times a day (TID) | INTRAMUSCULAR | Status: DC | PRN

## 2017-11-24 MED ORDER — LIDOCAINE 2% (20 MG/ML) 5 ML SYRINGE
INTRAMUSCULAR | Status: AC
  Filled 2017-11-24: qty 5

## 2017-11-24 MED ORDER — ONDANSETRON HCL 4 MG PO TABS: 4.0000 mg | ORAL_TABLET | Freq: Four times a day (QID) | ORAL | Status: DC | PRN

## 2017-11-24 MED ORDER — METOCLOPRAMIDE HCL 5 MG/ML IJ SOLN: 10.0000 mg | Freq: Once | INTRAMUSCULAR | Status: DC | PRN

## 2017-11-24 MED ORDER — FENTANYL CITRATE (PF) 100 MCG/2ML IJ SOLN
INTRAMUSCULAR | Status: DC | PRN
  Administered 2017-11-24: 100 ug via INTRAVENOUS

## 2017-11-24 MED ORDER — PROPOFOL 10 MG/ML IV BOLUS
INTRAVENOUS | Status: AC
  Filled 2017-11-24: qty 40

## 2017-11-24 MED ORDER — LACTATED RINGERS IV SOLN
INTRAVENOUS | Status: DC
  Administered 2017-11-24 (×3): via INTRAVENOUS

## 2017-11-24 MED ORDER — ACETAMINOPHEN 325 MG PO TABS: 325.0000 mg | ORAL_TABLET | Freq: Four times a day (QID) | ORAL | Status: DC | PRN

## 2017-11-24 MED ORDER — SODIUM CHLORIDE 0.9 % IV SOLN
INTRAVENOUS | Status: DC
  Administered 2017-11-24: 100 mL/h via INTRAVENOUS

## 2017-11-24 MED ORDER — CHLORHEXIDINE GLUCONATE 4 % EX LIQD: 60.0000 mL | Freq: Once | CUTANEOUS | Status: DC

## 2017-11-24 MED ORDER — CEFAZOLIN SODIUM-DEXTROSE 2-4 GM/100ML-% IV SOLN
2.0000 g | INTRAVENOUS | Status: AC
  Administered 2017-11-24: 2 g via INTRAVENOUS
  Filled 2017-11-24: qty 100

## 2017-11-24 MED ORDER — TAMSULOSIN HCL 0.4 MG PO CAPS
0.4000 mg | ORAL_CAPSULE | Freq: Every day | ORAL | Status: DC
  Administered 2017-11-24: 0.4 mg via ORAL
  Filled 2017-11-24: qty 1

## 2017-11-24 MED ORDER — METHOCARBAMOL 500 MG PO TABS
500.0000 mg | ORAL_TABLET | Freq: Four times a day (QID) | ORAL | Status: DC | PRN
  Administered 2017-11-24 – 2017-11-25 (×2): 500 mg via ORAL
  Filled 2017-11-24 (×2): qty 1

## 2017-11-24 MED ORDER — ALUM & MAG HYDROXIDE-SIMETH 200-200-20 MG/5ML PO SUSP: 15.0000 mL | ORAL | Status: DC | PRN

## 2017-11-24 MED ORDER — MIDAZOLAM HCL 2 MG/2ML IJ SOLN
INTRAMUSCULAR | Status: AC
  Filled 2017-11-24: qty 2

## 2017-11-24 MED ORDER — DEXAMETHASONE SODIUM PHOSPHATE 10 MG/ML IJ SOLN
10.0000 mg | Freq: Once | INTRAMUSCULAR | Status: AC
  Administered 2017-11-24: 10 mg via INTRAVENOUS

## 2017-11-24 MED ORDER — DIPHENHYDRAMINE HCL 12.5 MG/5ML PO ELIX: 12.5000 mg | ORAL_SOLUTION | ORAL | Status: DC | PRN

## 2017-11-24 MED ORDER — ONDANSETRON HCL 4 MG/2ML IJ SOLN
INTRAMUSCULAR | Status: DC | PRN
  Administered 2017-11-24: 4 mg via INTRAVENOUS

## 2017-11-24 MED ORDER — ONDANSETRON HCL 4 MG/2ML IJ SOLN
INTRAMUSCULAR | Status: AC
  Filled 2017-11-24: qty 2

## 2017-11-24 MED ORDER — AMLODIPINE BESYLATE 5 MG PO TABS
5.0000 mg | ORAL_TABLET | Freq: Every day | ORAL | Status: DC
  Administered 2017-11-25: 5 mg via ORAL
  Filled 2017-11-24: qty 1

## 2017-11-24 MED ORDER — MENTHOL 3 MG MT LOZG: 1.0000 | LOZENGE | OROMUCOSAL | Status: DC | PRN

## 2017-11-24 MED ORDER — HYDROMORPHONE HCL 1 MG/ML IJ SOLN
0.5000 mg | INTRAMUSCULAR | Status: DC | PRN
  Administered 2017-11-24: 1 mg via INTRAVENOUS
  Filled 2017-11-24: qty 1

## 2017-11-24 MED ORDER — LIDOCAINE 2% (20 MG/ML) 5 ML SYRINGE
INTRAMUSCULAR | Status: DC | PRN
  Administered 2017-11-24: 50 mg via INTRAVENOUS

## 2017-11-24 MED ORDER — BISACODYL 10 MG RE SUPP: 10.0000 mg | Freq: Every day | RECTAL | Status: DC | PRN

## 2017-11-24 MED ORDER — POLYETHYLENE GLYCOL 3350 17 G PO PACK
17.0000 g | PACK | Freq: Two times a day (BID) | ORAL | Status: DC
  Administered 2017-11-24: 17 g via ORAL
  Filled 2017-11-24: qty 1

## 2017-11-24 MED ORDER — DOCUSATE SODIUM 100 MG PO CAPS: 100.0000 mg | ORAL_CAPSULE | Freq: Two times a day (BID) | ORAL | 0 refills | Status: DC

## 2017-11-24 MED ORDER — EPHEDRINE SULFATE 50 MG/ML IJ SOLN
INTRAMUSCULAR | Status: AC
  Filled 2017-11-24: qty 1

## 2017-11-24 MED ORDER — METOPROLOL TARTRATE 25 MG PO TABS
25.0000 mg | ORAL_TABLET | Freq: Two times a day (BID) | ORAL | Status: DC
  Administered 2017-11-24 – 2017-11-25 (×2): 25 mg via ORAL
  Filled 2017-11-24 (×2): qty 1

## 2017-11-24 MED ORDER — DOCUSATE SODIUM 100 MG PO CAPS
100.0000 mg | ORAL_CAPSULE | Freq: Two times a day (BID) | ORAL | Status: DC
  Administered 2017-11-24 – 2017-11-25 (×2): 100 mg via ORAL
  Filled 2017-11-24 (×2): qty 1

## 2017-11-24 MED ORDER — NICOTINE 21 MG/24HR TD PT24
21.0000 mg | MEDICATED_PATCH | Freq: Every day | TRANSDERMAL | Status: DC
  Administered 2017-11-24: 21 mg via TRANSDERMAL
  Filled 2017-11-24 (×2): qty 1

## 2017-11-24 MED ORDER — METOCLOPRAMIDE HCL 5 MG PO TABS: 5.0000 mg | ORAL_TABLET | Freq: Three times a day (TID) | ORAL | Status: DC | PRN

## 2017-11-24 MED ORDER — PROPOFOL 500 MG/50ML IV EMUL
INTRAVENOUS | Status: DC | PRN
  Administered 2017-11-24: 100 ug/kg/min via INTRAVENOUS

## 2017-11-24 SURGICAL SUPPLY — 41 items
BAG ZIPLOCK 12X15 (MISCELLANEOUS) ×2 IMPLANT
BLADE SAG 18X100X1.27 (BLADE) ×2 IMPLANT
CAPT HIP TOTAL 2 ×2 IMPLANT
COVER PERINEAL POST (MISCELLANEOUS) ×2 IMPLANT
COVER SURGICAL LIGHT HANDLE (MISCELLANEOUS) ×2 IMPLANT
DERMABOND ADVANCED (GAUZE/BANDAGES/DRESSINGS) ×1
DERMABOND ADVANCED .7 DNX12 (GAUZE/BANDAGES/DRESSINGS) ×1 IMPLANT
DRAPE STERI IOBAN 125X83 (DRAPES) ×2 IMPLANT
DRAPE U-SHAPE 47X51 STRL (DRAPES) ×4 IMPLANT
DRESSING AQUACEL AG SP 3.5X10 (GAUZE/BANDAGES/DRESSINGS) ×1 IMPLANT
DRSG AQUACEL AG SP 3.5X10 (GAUZE/BANDAGES/DRESSINGS) ×2
DURAPREP 26ML APPLICATOR (WOUND CARE) ×2 IMPLANT
ELECT REM PT RETURN 15FT ADLT (MISCELLANEOUS) ×2 IMPLANT
GLOVE BIO SURGEON STRL SZ 6 (GLOVE) ×2 IMPLANT
GLOVE BIOGEL M STRL SZ7.5 (GLOVE) ×4 IMPLANT
GLOVE BIOGEL PI IND STRL 6.5 (GLOVE) ×1 IMPLANT
GLOVE BIOGEL PI IND STRL 7.0 (GLOVE) ×3 IMPLANT
GLOVE BIOGEL PI IND STRL 7.5 (GLOVE) ×4 IMPLANT
GLOVE BIOGEL PI IND STRL 8.5 (GLOVE) ×1 IMPLANT
GLOVE BIOGEL PI INDICATOR 6.5 (GLOVE) ×1
GLOVE BIOGEL PI INDICATOR 7.0 (GLOVE) ×3
GLOVE BIOGEL PI INDICATOR 7.5 (GLOVE) ×4
GLOVE BIOGEL PI INDICATOR 8.5 (GLOVE) ×1
GLOVE ECLIPSE 8.0 STRL XLNG CF (GLOVE) ×4 IMPLANT
GLOVE ORTHO TXT STRL SZ7.5 (GLOVE) ×2 IMPLANT
GLOVE SURG SS PI 7.0 STRL IVOR (GLOVE) ×2 IMPLANT
GOWN STRL REUS W/ TWL LRG LVL3 (GOWN DISPOSABLE) ×3 IMPLANT
GOWN STRL REUS W/ TWL XL LVL3 (GOWN DISPOSABLE) ×2 IMPLANT
GOWN STRL REUS W/TWL LRG LVL3 (GOWN DISPOSABLE) ×5 IMPLANT
GOWN STRL REUS W/TWL XL LVL3 (GOWN DISPOSABLE) ×2
HOLDER FOLEY CATH W/STRAP (MISCELLANEOUS) ×2 IMPLANT
PACK ANTERIOR HIP CUSTOM (KITS) ×2 IMPLANT
SHIELD SPLASH 9X12 PIC/PGM (MISCELLANEOUS) ×2 IMPLANT
SUT MNCRL AB 4-0 PS2 18 (SUTURE) ×2 IMPLANT
SUT STRATAFIX 0 PDS 27 VIOLET (SUTURE) ×2
SUT VIC AB 1 CT1 36 (SUTURE) ×6 IMPLANT
SUT VIC AB 2-0 CT1 27 (SUTURE) ×3
SUT VIC AB 2-0 CT1 TAPERPNT 27 (SUTURE) ×3 IMPLANT
SUTURE STRATFX 0 PDS 27 VIOLET (SUTURE) ×1 IMPLANT
TRAY FOLEY MTR SLVR 16FR STAT (SET/KITS/TRAYS/PACK) ×2 IMPLANT
YANKAUER SUCT BULB TIP 10FT TU (MISCELLANEOUS) ×2 IMPLANT

## 2017-11-24 NOTE — Interval H&P Note (Signed)
History and Physical Interval Note:  11/24/2017 10:11 AM  James Mendez  has presented today for surgery, with the diagnosis of Left hip osteoarthritis  The various methods of treatment have been discussed with the patient and family. After consideration of risks, benefits and other options for treatment, the patient has consented to  Procedure(s) with comments: LEFT TOTAL HIP ARTHROPLASTY ANTERIOR APPROACH (Left) - 70 mins as a surgical intervention .  The patient's history has been reviewed, patient examined, no change in status, stable for surgery.  I have reviewed the patient's chart and labs.  Questions were answered to the patient's satisfaction.     Shelda PalMatthew D Jadie Comas

## 2017-11-24 NOTE — Anesthesia Postprocedure Evaluation (Signed)
Anesthesia Post Note  Patient: Fredna Dowhomas E Coghill  Procedure(s) Performed: LEFT TOTAL HIP ARTHROPLASTY ANTERIOR APPROACH (Left Hip)     Patient location during evaluation: PACU Anesthesia Type: Spinal Level of consciousness: awake and alert Pain management: pain level controlled Vital Signs Assessment: post-procedure vital signs reviewed and stable Respiratory status: spontaneous breathing and respiratory function stable Cardiovascular status: blood pressure returned to baseline and stable Postop Assessment: no headache, no backache, spinal receding and no apparent nausea or vomiting Anesthetic complications: no    Last Vitals:  Vitals:   11/24/17 1506 11/24/17 1552  BP: (!) 158/94 (!) 148/83  Pulse: 79 93  Resp: 16 15  Temp: 36.4 C 36.6 C  SpO2: 100% 98%    Last Pain:  Vitals:   11/24/17 1552  TempSrc: Oral  PainSc:                  Phillips Groutarignan, Keyden Pavlov

## 2017-11-24 NOTE — Discharge Instructions (Signed)

## 2017-11-24 NOTE — Evaluation (Signed)
Physical Therapy Evaluation Patient Details Name: James Mendez MRN: [id number] DOB: 10/29/1955 Today's Date: 11/24/2017   History of Present Illness  pt s/p L DATHA 11/24/2017 with h/o a fall in Dec resulting in pelvic fracture (per pt) and was in a trauma center. Pt also states history of a back fall 3 years ago resulting in him having ot use crutches for the past 3 years.   Clinical Impression  Pt is s/p THA resulting in the deficits listed below (see PT Problem List).Pt will benefit from acute PT to increase their independence and safety with mobility to allow discharge home with wife.      Follow Up Recommendations No PT follow up;Follow surgeon's recommendation for DC plan and follow-up therapies    Equipment Recommendations  Other (comment)(however pt states his RW is very wide and large for him. I asked him to bring it in if he wants [place] to evaluate it)    Recommendations for Other Services       Precautions / Restrictions Precautions Precautions: None Restrictions Weight Bearing Restrictions: No      Mobility  Bed Mobility Overal bed mobility: Needs Assistance Bed Mobility: Supine to Sit;Sit to Supine     Supine to sit: Min assist     General bed mobility comments: cues for safety and movement  Transfers Overall transfer level: Needs assistance Equipment used: Rolling walker (2 wheeled) Transfers: Sit to/from Stand Sit to Stand: Min assist         General transfer comment: cues for safety and hand placement   Ambulation/Gait Ambulation/Gait assistance: Min assist Ambulation Distance (Feet): 50 Feet Assistive device: Rolling walker (2 wheeled) Gait Pattern/deviations: Step-to pattern(then transitioned to step to pattern. ) Gait velocity: slow but steady   General Gait Details: pt with some IR on L LE and ER on RLE with gait.   Stairs            Wheelchair Mobility    Modified Rankin (Stroke Patients Only)       Balance Overall balance  assessment: Mild deficits observed, not formally tested                                           Pertinent Vitals/Pain Pain Assessment: 0-10 Pain Score: 7 (initially started at 9/10 but after mobilizing ended up feeling better at 7/10 ) Pain Location: L hip area Pain Descriptors / Indicators: Aching Pain Intervention(s): Monitored during session;Ice applied    Home Living Family/patient expects to be discharged to:: Private residence Living Arrangements: Spouse/significant other Available Help at Discharge: Family Type of Home: House Home Access: Stairs to enter Entrance Stairs-Rails: None Entrance Stairs-Number of Steps: 1 Home Layout: Able to live on main level with bedroom/bathroom(but his bedroom is in the basement) Home Equipment: Walker - 2 wheels;Crutches Additional Comments: states he has a RW that his friends let him borrow , however it seems wide and possibly too big for him. I stated he could bring it in for [place] to evaluate if he would like     Prior Function Level of Independence: Independent with assistive device(s)         Comments: pt uses Bilateral crutches , occassionally has tried to use RW but too big, and uses motorized WC once to a shopping area (not his own)      Hand Dominance  Extremity/Trunk Assessment        Lower Extremity Assessment Lower Extremity Assessment: Overall WFL for tasks assessed(tight L hip flexors and abductors )       Communication   Communication: No difficulties  Cognition Arousal/Alertness: Awake/alert Behavior During Therapy: WFL for tasks assessed/performed Overall Cognitive Status: Within Functional Limits for tasks assessed                                        General Comments      Exercises Total Joint Exercises Ankle Circles/Pumps: AROM;Both;Supine;10 reps Quad Sets: AROM;Left;5 reps;Supine Heel Slides: AAROM;5 reps;Left;Supine Hip ABduction/ADduction:  AAROM;Left;5 reps;Supine(limited ROM for hip abd)   Assessment/Plan    PT Assessment Patient needs continued PT services  PT Problem List Decreased strength;Decreased mobility;Decreased activity tolerance;Decreased range of motion       PT Treatment Interventions DME instruction;Gait training;Therapeutic activities;Therapeutic exercise;Stair training;Balance training;Functional mobility training;Patient/family education    PT Goals (Current goals can be found in the Care Plan section)  Acute Rehab PT Goals Patient Stated Goal: I want to eventually be able to walk without an assistive device  PT Goal Formulation: With patient Time For Goal Achievement: 12/08/17 Potential to Achieve Goals: Good    Frequency 7X/week   Barriers to discharge        Co-evaluation               AM-PAC PT "6 Clicks" Daily Activity  Outcome Measure Difficulty turning over in bed (including adjusting bedclothes, sheets and blankets)?: A Little Difficulty moving from lying on back to sitting on the side of the bed? : A Little Difficulty sitting down on and standing up from a chair with arms (e.g., wheelchair, bedside commode, etc,.)?: A Little Help needed moving to and from a bed to chair (including a wheelchair)?: A Little Help needed walking in hospital room?: A Little Help needed climbing 3-5 steps with a railing? : A Little 6 Click Score: 18    End of Session Equipment Utilized During Treatment: Gait belt Activity Tolerance: Patient tolerated treatment well Patient left: in chair;with chair alarm set Nurse Communication: Mobility status PT Visit Diagnosis: Other abnormalities of gait and mobility (R26.89)    Time: [id number] PT Time Calculation (min) (ACUTE ONLY): 25 min   Charges:   PT Evaluation $PT Eval Low Complexity: 1 Low PT Treatments $Gait Training: 8-22 mins   PT G CodesMarella Bile:        Analysse Quinonez, PT Pager: [phone] 11/24/2017   Mariana Wiederholt, Clois DupesSHARRON 11/24/2017, 6:32 PM

## 2017-11-24 NOTE — Op Note (Addendum)
NAME:  James Mendez                ACCOUNT NO.: [id number]      MEDICAL RECORD NO.: [id number]      FACILITY:  [facility]      PHYSICIAN:  Shelda Pal  DATE OF BIRTH:  1955/10/29     DATE OF PROCEDURE:  11/24/2017                                 OPERATIVE REPORT         PREOPERATIVE DIAGNOSIS: Left  hip osteoarthritis.      POSTOPERATIVE DIAGNOSIS:  Left hip osteoarthritis.      PROCEDURE:  Left total hip replacement through an anterior approach   utilizing DePuy THR system, component size 54mm pinnacle cup, a size 36+4 neutral   Altrex liner, a size 8 Hi Tri Lock stem with a 36+8.5 delta ceramic   ball.      SURGEON:  Madlyn Frankel. Charlann Boxer, M.D.      ASSISTANT:  Skip Mayer, PA-C     ANESTHESIA:  Spinal.      SPECIMENS:  None.      COMPLICATIONS:  None.      BLOOD LOSS:  300 cc     DRAINS:  None.      INDICATION OF THE PROCEDURE:  James Mendez is a 62 y.o. male who had   presented to office for evaluation of left hip pain.  Radiographs revealed   progressive degenerative changes with bone-on-bone   articulation to the  hip joint.  The patient had painful limited range of   motion significantly affecting their overall quality of life.  The patient was failing to    respond to conservative measures, and at this point was ready   to proceed with more definitive measures.  The patient has noted progressive   degenerative changes in his hip, progressive problems and dysfunction   with regarding the hip prior to surgery.  Consent was obtained for   benefit of pain relief.  Specific risk of infection, DVT, component   failure, dislocation, need for revision surgery, as well discussion of   the anterior versus posterior approach were reviewed.  Consent was   obtained for benefit of anterior pain relief through an anterior   approach.      PROCEDURE IN DETAIL:  The patient was brought to operative theater.   Once adequate anesthesia, preoperative  antibiotics, 2 gm of Ancef, 1 gm of Tranexamic Acid, and 10 mg of Decadron administered.   The patient was positioned supine on the OSI Hanna table.  Once adequate   padding of boney process was carried out, we had predraped out the hip, and  used fluoroscopy to confirm orientation of the pelvis and position.      The left hip was then prepped and draped from proximal iliac crest to   mid thigh with shower curtain technique.      Time-out was performed identifying the patient, planned procedure, and   extremity.     An incision was then made 2 cm distal and lateral to the   anterior superior iliac spine extending over the orientation of the   tensor fascia lata muscle and sharp dissection was carried down to the   fascia of the muscle and protractor placed in the soft tissues.      The  fascia was then incised.  The muscle belly was identified and swept   laterally and retractor placed along the superior neck.  Following   cauterization of the circumflex vessels and removing some pericapsular   fat, a second cobra retractor was placed on the inferior neck.  A third   retractor was placed on the anterior acetabulum after elevating the   anterior rectus.  A capsulectomy was performed due to the significant degenerative changes resulting in adhesive capsular tissue.  We then identified the trochanteric fossa and   orientation of my neck cut, confirmed this radiographically   and then made a neck osteotomy with the femur on traction.  The femoral   head was removed without difficulty or complication.  Traction was let   off and retractors were placed posterior and anterior around the   acetabulum.      The labrum and foveal tissue were debrided.  I began reaming with a 45mm   reamer and reamed up to 53mm reamer with good bony bed preparation and a 54mm   cup was chosen.  The final 54mm Pinnacle cup was then impacted under fluoroscopy  to confirm the depth of penetration and orientation with  respect to   abduction.  A screw was placed followed by the hole eliminator.  The final   36+4 neutral Altrex liner was impacted with good visualized rim fit.  The cup was positioned anatomically within the acetabular portion of the pelvis.      At this point, the femur was rolled at 80 degrees.  Further capsule was   released off the inferior aspect of the femoral neck.  I then   released the superior capsule proximally.  The hook was placed laterally   along the femur and elevated manually and held in position with the bed   hook.  The leg was then extended and adducted with the leg rolled to 100   degrees of external rotation.  Once the proximal femur was fully   exposed, I used a box osteotome to set orientation.  I then began   broaching with the starting chili pepper broach and passed this by hand and then broached up to 8.  With the 8 broach in place I chose a high offset neck and did several trial reductions.  The offset was appropriate, leg lengths   appeared to be equal best matched with the +8.5 head ball confirmed radiographically.   Given these findings, I went ahead and dislocated the hip, repositioned all   retractors and positioned the right hip in the extended and abducted position.  The final 8 Hi Tri Lock stem was   chosen and it was impacted down to the level of neck cut.  Based on this   and the trial reduction, a 36+8.5 delta ceramic ball was chosen and   impacted onto a clean and dry trunnion, and the hip was reduced.  The   hip had been irrigated throughout the case again at this point.  The fascia of the   tensor fascia lata muscle was then reapproximated using #1 Vicryl and #0 Stratafix sutures.  The   remaining wound was closed with 2-0 Vicryl and running 4-0 Monocryl.   The hip was cleaned, dried, and dressed sterilely using Dermabond and   Aquacel dressing.  He was then brought   to recovery room in stable condition tolerating the procedure well.    Skip Mayer, PA-C was present for the entirety of the  case involved from   preoperative positioning, perioperative retractor management, general   facilitation of the case, as well as primary wound closure as assistant.            Madlyn FrankelMatthew D. Charlann Boxerlin, M.D.        11/24/2017 12:47 PM

## 2017-11-24 NOTE — Transfer of Care (Signed)
Immediate Anesthesia Transfer of Care Note  Patient: James Mendez  Procedure(s) Performed: LEFT TOTAL HIP ARTHROPLASTY ANTERIOR APPROACH (Left Hip)  Patient Location: PACU  Anesthesia Type:Regional and Spinal  Level of Consciousness: awake, alert  and oriented  Airway & Oxygen Therapy: Patient Spontanous Breathing and Patient connected to face mask oxygen  Post-op Assessment: Report given to RN and Post -op Vital signs reviewed and stable  Post vital signs: Reviewed and stable  Last Vitals:  Vitals Value Taken Time  BP    Temp    Pulse 33 11/24/2017  1:21 PM  Resp 17 11/24/2017  1:21 PM  SpO2 80 % 11/24/2017  1:21 PM  Vitals shown include unvalidated device data.  Last Pain:  Vitals:   11/24/17 0859  TempSrc:   PainSc: 10-Worst pain ever      Patients Stated Pain Goal: 4 (11/24/17 0859)  Complications: No apparent anesthesia complications

## 2017-11-24 NOTE — Anesthesia Procedure Notes (Signed)
Spinal  Patient location during procedure: OR End time: 11/24/2017 11:16 AM Staffing Resident/CRNA: Noralyn Pick D, CRNA Performed: anesthesiologist  Preanesthetic Checklist Completed: patient identified, site marked, surgical consent, pre-op evaluation, timeout performed, IV checked, risks and benefits discussed and monitors and equipment checked Spinal Block Patient position: sitting Prep: Betadine Patient monitoring: heart rate, continuous pulse ox and blood pressure Approach: midline Location: L2-3 Injection technique: single-shot Needle Needle type: Sprotte  Needle gauge: 24 G Needle length: 9 cm Assessment Sensory level: T6 Additional Notes Expiration date of kit checked and confirmed. Patient tolerated procedure well, without complications.

## 2017-11-24 NOTE — Anesthesia Preprocedure Evaluation (Signed)
Anesthesia Evaluation  Patient identified by MRN, date of birth, ID band Patient awake    Reviewed: Allergy & Precautions, NPO status , Patient's Chart, lab work & pertinent test results  Airway Mallampati: II  TM Distance: >3 FB Neck ROM: Full    Dental no notable dental hx.    Pulmonary Current Smoker,    Pulmonary exam normal breath sounds clear to auscultation       Cardiovascular hypertension, Pt. on medications Normal cardiovascular exam Rhythm:Regular Rate:Normal     Neuro/Psych negative neurological ROS  negative psych ROS   GI/Hepatic negative GI ROS, Neg liver ROS,   Endo/Other  negative endocrine ROS  Renal/GU negative Renal ROS  negative genitourinary   Musculoskeletal negative musculoskeletal ROS (+)   Abdominal   Peds negative pediatric ROS (+)  Hematology negative hematology ROS (+)   Anesthesia Other Findings   Reproductive/Obstetrics negative OB ROS                             Anesthesia Physical Anesthesia Plan  ASA: II  Anesthesia Plan: Spinal   Post-op Pain Management:    Induction: Intravenous  PONV Risk Score and Plan: 0 and Treatment may vary due to age or medical condition  Airway Management Planned: Simple Face Mask  Additional Equipment:   Intra-op Plan:   Post-operative Plan:   Informed Consent: I have reviewed the patients History and Physical, chart, labs and discussed the procedure including the risks, benefits and alternatives for the proposed anesthesia with the patient or authorized representative who has indicated his/her understanding and acceptance.   Dental advisory given  Plan Discussed with: CRNA  Anesthesia Plan Comments:         Anesthesia Quick Evaluation

## 2017-11-25 DIAGNOSIS — E669 Obesity, unspecified: Secondary | ICD-10-CM | POA: Diagnosis present

## 2017-11-25 LAB — BASIC METABOLIC PANEL
Anion gap: 7 (ref 5–15)
BUN: 11 mg/dL (ref 6–20)
CALCIUM: 8.4 mg/dL — AB (ref 8.9–10.3)
CO2: 29 mmol/L (ref 22–32)
CREATININE: 0.79 mg/dL (ref 0.61–1.24)
Chloride: 106 mmol/L (ref 101–111)
GFR calc non Af Amer: >60 mL/min (ref >60)
Glucose, Bld: 125 mg/dL — ABNORMAL HIGH (ref 65–99)
Potassium: 3.9 mmol/L (ref 3.5–5.1)
Sodium: 142 mmol/L (ref 135–145)

## 2017-11-25 LAB — CBC
HEMATOCRIT: 32.8 % — AB (ref 39.0–52.0)
Hemoglobin: 10.7 g/dL — ABNORMAL LOW (ref 13.0–17.0)
MCH: 32 pg (ref 26.0–34.0)
MCHC: 32.6 g/dL (ref 30.0–36.0)
MCV: 98.2 fL (ref 78.0–100.0)
Platelets: 271 10*3/uL (ref 150–400)
RBC: 3.34 MIL/uL — ABNORMAL LOW (ref 4.22–5.81)
RDW: 12.9 % (ref 11.5–15.5)
WBC: 11.2 10*3/uL — ABNORMAL HIGH (ref 4.0–10.5)

## 2017-11-25 NOTE — Progress Notes (Signed)
     Subjective: 1 Day Post-Op Procedure(s) (LRB): LEFT TOTAL HIP ARTHROPLASTY ANTERIOR APPROACH (Left)    Seen by Dr. Charlann Boxerlin. Patient reports pain as mild, pain controlled. No events throughout the night. Looking forward to progressing.  Ready to be discharged home, if he does well with PT.      Objective:   VITALS:   Vitals:   11/25/17 0124 11/25/17 0609  BP: 128/80 (!) 156/79  Pulse: 85 75  Resp: 16 16  Temp: 97.9 F (36.6 C) 97.8 F (36.6 C)  SpO2: 98% 98%    Dorsiflexion/Plantar flexion intact Incision: dressing C/D/I No cellulitis present Compartment soft  LABS Recent Labs    11/25/17 0522  HGB 10.7*  HCT 32.8*  WBC 11.2*  PLT 271    Recent Labs    11/25/17 0522  NA 142  K 3.9  BUN 11  CREATININE 0.79  GLUCOSE 125*     Assessment/Plan: 1 Day Post-Op Procedure(s) (LRB): LEFT TOTAL HIP ARTHROPLASTY ANTERIOR APPROACH (Left) Foley cath d/c'ed Advance diet Up with therapy D/C IV fluids Discharge home [facility] Follow up with OLIN,Shanicka Oldenkamp D in 2 weeks.  Contact information:  [facility]) [address], [address] [place] [id number] [phone]    Obese (BMI 30-39.9) Estimated body mass index is 30.38 kg/m as calculated from the following:   Height as of this encounter: 5\' 7"  (1.702 m).   Weight as of this encounter: 88 kg (194 lb). Patient also counseled that weight may inhibit the healing process Patient counseled that losing weight will help with future health issues        Anastasio AuerbachMatthew S. Meela Wareing   PAC  11/25/2017, 7:37 AM

## 2017-11-25 NOTE — Progress Notes (Signed)
Physical Therapy Treatment Patient Details Name: James Mendez MRN: [id number] DOB: 1955/09/09 Today's Date: 11/25/2017    History of Present Illness pt s/p L DATHA 11/24/2017 with h/o a fall in Dec resulting in pelvic fracture (per pt) and was in a trauma center. Pt also states history of a back fall 3 years ago resulting in him having ot use crutches for the past 3 years.     PT Comments    POD # 1 am session Assisted OOB to amb in hallway then performed some THR TE's following HEP handout.  Instructed on proper tech, freq as well as use of ICE.  Pt will need another session to address stairs and complete HEP program.   Follow Up Recommendations  No PT follow up;Follow surgeon's recommendation for DC plan and follow-up therapies     Equipment Recommendations  Rolling walker with 5" wheels    Recommendations for Other Services       Precautions / Restrictions Precautions Precautions: None Restrictions Weight Bearing Restrictions: No    Mobility  Bed Mobility Overal bed mobility: Needs Assistance Bed Mobility: Supine to Sit     Supine to sit: Min assist     General bed mobility comments: assist L LE and increased time  Transfers Overall transfer level: Needs assistance Equipment used: Rolling walker (2 wheeled) Transfers: Sit to/from Stand Sit to Stand: Min assist;Min guard         General transfer comment: 50% VC's on proper hand placement with both sit to stand and stand to sit  Ambulation/Gait Ambulation/Gait assistance: Supervision;Min guard Ambulation Distance (Feet): 65 Feet Assistive device: Rolling walker (2 wheeled)   Gait velocity: decreased   General Gait Details: 25% VC's on proper walker to self distance and safety with turns   Social research officer, government Rankin (Stroke Patients Only)       Balance                                            Cognition Arousal/Alertness:  Awake/alert Behavior During Therapy: WFL for tasks assessed/performed Overall Cognitive Status: Within Functional Limits for tasks assessed                                        Exercises   Total Hip Replacement TE's 10 reps ankle pumps 10 reps knee presses 10 reps heel slides 10 reps SAQ's 10 reps ABD Followed by ICE     General Comments        Pertinent Vitals/Pain Pain Assessment: 0-10 Pain Score: 9  Pain Location: L hip area Pain Descriptors / Indicators: Aching;Operative site guarding Pain Intervention(s): Monitored during session;Repositioned;Patient requesting pain meds-RN notified;Ice applied    Home Living                      Prior Function            PT Goals (current goals can now be found in the care plan section) Progress towards PT goals: Progressing toward goals    Frequency    7X/week      PT Plan Current plan remains appropriate    Co-evaluation  AM-PAC PT "6 Clicks" Daily Activity  Outcome Measure  Difficulty turning over in bed (including adjusting bedclothes, sheets and blankets)?: A Little Difficulty moving from lying on back to sitting on the side of the bed? : A Little Difficulty sitting down on and standing up from a chair with arms (e.g., wheelchair, bedside commode, etc,.)?: A Little Help needed moving to and from a bed to chair (including a wheelchair)?: A Little Help needed walking in hospital room?: A Little Help needed climbing 3-5 steps with a railing? : A Little 6 Click Score: 18    End of Session Equipment Utilized During Treatment: Gait belt Activity Tolerance: Patient tolerated treatment well Patient left: in chair;with chair alarm set Nurse Communication: Mobility status PT Visit Diagnosis: Other abnormalities of gait and mobility (R26.89)     Time: [id number] PT Time Calculation (min) (ACUTE ONLY): 24 min  Charges:  $Gait Training: 8-22 mins $Therapeutic Exercise:  8-22 mins                    G Codes:       Felecia ShellingLori Jalilah Wiltsie  PTA [facility]  Acute  Rehab Pager      [phone]

## 2017-11-25 NOTE — Progress Notes (Signed)
    Durable Medical Equipment  (From admission, onward)        Start     Ordered   11/25/17 0935  For home use only DME Walker rolling  Once    Question:  Patient needs a walker to treat with the following condition  Answer:  Surgery, elective   11/25/17 0935    [phone]

## 2017-11-25 NOTE — Progress Notes (Signed)
Physical Therapy Treatment Patient Details Name: James Mendez MRN: [id number] DOB: [birth year] Today's Date: 11/25/2017    History of Present Illness pt s/p L DATHA 11/24/2017 with h/o a fall in Dec resulting in pelvic fracture (per pt) and was in a trauma center. Pt also states history of a back fall 3 years ago resulting in him having ot use crutches for the past 3 years.     PT Comments    POD # 1 Assisted with amb a greater distance, practiced one step twice.  Pt stated his walker at home was wider.  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  Reported to CN pt needs a RW for D/C to home.  Performed all standing TE's following HEP handout.  Instructed on proper tech, freq as well as use of ICE.   Pt has met goals to D/C to home.   Follow Up Recommendations  No PT follow up;Follow surgeon's recommendation for DC plan and follow-up therapies     Equipment Recommendations  Rolling walker with 5" wheels    Recommendations for Other Services       Precautions / Restrictions Precautions Precautions: None Restrictions Weight Bearing Restrictions: No    Mobility  Bed Mobility     General bed mobility comments: OOB in recliner  Transfers Overall transfer level: Needs assistance Equipment used: Rolling walker (2 wheeled) Transfers: Sit to/from Stand Sit to Stand: Min assist;Min guard         General transfer comment: <25% VC's on proper hand placement with both sit to stand and stand to sit  Ambulation/Gait Ambulation/Gait assistance: Supervision;Min guard Ambulation Distance (Feet): 75 Feet Assistive device: Rolling walker (2 wheeled)   Gait velocity: decreased   General Gait Details: 25% VC's on proper walker to self distance and safety with turns   Stairs Stairs: Yes Stairs assistance: Supervision;Min guard Stair Management: No rails;Step to pattern;With walker Number of Stairs: 1 General stair comments: 25% VC's on proper tech and sequencing.  Performed  twice.     Wheelchair Mobility    Modified Rankin (Stroke Patients Only)       Balance                                            Cognition Arousal/Alertness: Awake/alert Behavior During Therapy: WFL for tasks assessed/performed Overall Cognitive Status: Within Functional Limits for tasks assessed                                        Exercises   10 reps all standing TE's L LE 10 reps LAQ's L LE   General Comments        Pertinent Vitals/Pain Pain Assessment: 0-10 Pain Score: 9  Pain Location: L hip area Pain Descriptors / Indicators: Aching;Operative site guarding Pain Intervention(s): Monitored during session;Repositioned;Patient requesting pain meds-RN notified;Ice applied    Home Living                      Prior Function            PT Goals (current goals can now be found in the care plan section) Progress towards PT goals: Progressing toward goals    Frequency    7X/week      PT Plan  Current plan remains appropriate    Co-evaluation              AM-PAC PT "6 Clicks" Daily Activity  Outcome Measure  Difficulty turning over in bed (including adjusting bedclothes, sheets and blankets)?: A Little Difficulty moving from lying on back to sitting on the side of the bed? : A Little Difficulty sitting down on and standing up from a chair with arms (e.g., wheelchair, bedside commode, etc,.)?: A Little Help needed moving to and from a bed to chair (including a wheelchair)?: A Little Help needed walking in hospital room?: A Little Help needed climbing 3-5 steps with a railing? : A Little 6 Click Score: 18    End of Session Equipment Utilized During Treatment: Gait belt Activity Tolerance: Patient tolerated treatment well Patient left: in chair;with chair alarm set Nurse Communication: Mobility status PT Visit Diagnosis: Other abnormalities of gait and mobility (R26.89)     Time: 0315-9458 PT Time  Calculation (min) (ACUTE ONLY): 23 min  Charges:  $Gait Training: 8-22 mins $Therapeutic Exercise: 8-22 mins                    G Codes:       Rica Koyanagi  PTA [facility]  Acute  Rehab Pager      [phone]

## 2017-11-26 NOTE — Discharge Summary (Signed)
Physician Discharge Summary  Patient ID: James Dowhomas E Jaggi MRN: [id number] DOB/AGE: 62/28/1957 62 y.o.  Admit date: 11/24/2017 Discharge date: 11/25/2017   Procedures:  Procedure(s) (LRB): LEFT TOTAL HIP ARTHROPLASTY ANTERIOR APPROACH (Left)  Attending Physician:  Dr. Durene RomansMatthew Olin   Admission Diagnoses:   Left hip primary OA / pain  Discharge Diagnoses:  Principal Problem:   S/P left THA, AA Active Problems:   Obese  Past Medical History:  Diagnosis Date  . Arthritis   . GERD (gastroesophageal reflux disease)   . Hypertension    controlled with meds    HPI:    James Mendez, 62 y.o. male, has a history of pain and functional disability in the left hip(s) due to arthritis and patient has failed non-surgical conservative treatments for greater than 12 weeks to include NSAID's and/or analgesics, corticosteriod injections, use of assistive devices and activity modification.  Onset of symptoms was gradual starting 5+ years ago with gradually worsening course since that time.The patient noted no past surgery on the left hip(s).  Patient currently rates pain in the left hip at 10 out of 10 with activity. Patient has night pain, worsening of pain with activity and weight bearing, trendelenberg gait, pain that interfers with activities of daily living and pain with passive range of motion. Patient has evidence of periarticular osteophytes and joint space narrowing by imaging studies. This condition presents safety issues increasing the risk of falls. There is no current active infection.  Risks, benefits and expectations were discussed with the patient.  Risks including but not limited to the risk of anesthesia, blood clots, nerve damage, blood vessel damage, failure of the prosthesis, infection and up to and including death.  Patient understand the risks, benefits and expectations and wishes to proceed with surgery.   PCP: System, Pcp Not In   Discharged Condition: good  Hospital Course:   Patient underwent the above stated procedure on 11/24/2017. Patient tolerated the procedure well and brought to the recovery room in good condition and subsequently to the floor.  POD #1 BP: 156/79 ; Pulse: 75 ; Temp: 97.8 F (36.6 C) ; Resp: 16 Patient reports pain as mild, pain controlled. No events throughout the night. Looking forward to progressing.  Ready to be discharged home. Dorsiflexion/plantar flexion intact, incision: dressing C/D/I, no cellulitis present and compartment soft.   LABS  Basename    HGB     10.7  HCT     32.8    Discharge Exam: General appearance: alert, cooperative and no distress Extremities: Homans sign is negative, no sign of DVT, no edema, redness or tenderness in the calves or thighs and no ulcers, gangrene or trophic changes  Disposition:  Home with follow up in 2 weeks   Follow-up Information    Durene Romanslin, Keith Cancio, MD. Schedule an appointment as soon as possible for a visit in 2 weeks.   Specialty:  Orthopedic Surgery Contact information: [address] [place] [id number] [phone]           Discharge Instructions    Call MD / Call 911   Complete by:  As directed    If you experience chest pain or shortness of breath, CALL 911 and be transported to the hospital emergency room.  If you develope a fever above 101 F, pus (white drainage) or increased drainage or redness at the wound, or calf pain, call your surgeon's office.   Change dressing   Complete by:  As directed  Maintain surgical dressing until follow up in the clinic. If the edges start to pull up, may reinforce with tape. If the dressing is no longer working, may remove and cover with gauze and tape, but must keep the area dry and clean.  Call with any questions or concerns.   Constipation Prevention   Complete by:  As directed    Drink plenty of fluids.  Prune juice may be helpful.  You may use a stool softener, such as Colace (over the counter) 100 mg twice a day.   Use MiraLax (over the counter) for constipation as needed.   Diet - low sodium heart healthy   Complete by:  As directed    Discharge instructions   Complete by:  As directed    Maintain surgical dressing until follow up in the clinic. If the edges start to pull up, may reinforce with tape. If the dressing is no longer working, may remove and cover with gauze and tape, but must keep the area dry and clean.  Follow up in 2 weeks at [facility]. Call with any questions or concerns.   Increase activity slowly as tolerated   Complete by:  As directed    Weight bearing as tolerated with assist device (walker, cane, etc) as directed, use it as long as suggested by your surgeon or therapist, typically at least 4-6 weeks.   TED hose   Complete by:  As directed    Use stockings (TED hose) for 2 weeks on both leg(s).  You may remove them at night for sleeping.      Allergies as of 11/25/2017      Reactions   Adhesive [tape] Rash      Medication List    STOP taking these medications   Diclofenac Sodium CR 100 MG 24 hr tablet   traMADol 50 MG tablet Commonly known as:  ULTRAM     TAKE these medications   amLODipine 5 MG tablet Commonly known as:  NORVASC Take 5 mg by mouth daily.   aspirin 81 MG chewable tablet Commonly known as:  ASPIRIN CHILDRENS Chew 1 tablet (81 mg total) by mouth 2 (two) times daily. Take for 4 weeks, then resume regular dose.   docusate sodium 100 MG capsule Commonly known as:  COLACE Take 1 capsule (100 mg total) by mouth 2 (two) times daily.   ferrous sulfate 325 (65 FE) MG tablet Commonly known as:  FERROUSUL Take 1 tablet (325 mg total) by mouth 3 (three) times daily with meals.   finasteride 5 MG tablet Commonly known as:  PROSCAR Take 5 mg by mouth daily.   gabapentin 100 MG capsule Commonly known as:  NEURONTIN Take 100 mg by mouth 3 (three) times daily.   HYDROcodone-acetaminophen 7.5-325 MG tablet Commonly known as:  NORCO Take 1-2  tablets by mouth every 4 (four) hours as needed for moderate pain.   Lidocaine 5 % Crea Apply 1 application topically daily as needed (pain).   losartan 50 MG tablet Commonly known as:  COZAAR Take 50 mg by mouth daily.   methocarbamol 500 MG tablet Commonly known as:  ROBAXIN Take 1 tablet (500 mg total) by mouth every 6 (six) hours as needed for muscle spasms.   metoprolol tartrate 25 MG tablet Commonly known as:  LOPRESSOR Take 25 mg by mouth 2 (two) times daily.   nicotine 21 mg/24hr patch Commonly known as:  NICODERM CQ - dosed in mg/24 hours Place 21 mg onto the skin daily.  pantoprazole 40 MG tablet Commonly known as:  PROTONIX Take 40 mg by mouth daily.   polyethylene glycol packet Commonly known as:  MIRALAX / GLYCOLAX Take 17 g by mouth 2 (two) times daily.   tamsulosin 0.4 MG Caps capsule Commonly known as:  FLOMAX Take 0.4 mg by mouth at bedtime.            Discharge Care Instructions  (From admission, onward)        Start     Ordered   11/25/17 0000  Change dressing    Comments:  Maintain surgical dressing until follow up in the clinic. If the edges start to pull up, may reinforce with tape. If the dressing is no longer working, may remove and cover with gauze and tape, but must keep the area dry and clean.  Call with any questions or concerns.   11/25/17 1610       Signed: Anastasio Auerbach. Adasia Hoar   PA-C  11/26/2017, 11:03 AM

## 2018-02-10 NOTE — Patient Instructions (Addendum)
James Mendez  02/10/2018   Your procedure is scheduled on:   02-23-2018  Report [facility]  Entr[facility]  Report to admitting at  7:30 AM    Call this number if you have problems the morning of surgery [phone]   Remember: Do not eat food or drink liquids :After Midnight.     Take these medicines the morning of surgery with A SIP OF WATER:  GABAPENTIN, METOPROLOL, AMLODIPINE, STOOL SOFTNER, FINASTERIDE, PROTONIX,                       TAKE TRAMADOL AND METHOCARBAMOL  AS NEEDED                                You may not have any metal on your body including hair pins and              piercings               Do not wear jewelry, make-up, lotions, powders or perfumes, deodorant                          Men may shave face and neck.   Do not bring valuables to the hospital. [place] IS NOT             RESPONSIBLE   FOR VALUABLES.  Contacts, dentures or bridgework may not be worn into surgery.  Leave suitcase in the car. After surgery it may be brought to your room.     Special Instructions: N/A              Please read over the following fact sheets you were given: _____________________________________________________________________            [facility] - Preparing [facility] Before surgery, you can play an important role.  Because skin is not sterile, your skin needs to be as free of germs as possible.  You can reduce the number of germs on your skin by washing with CHG (chlorahexidine gluconate) soap before surgery.  CHG is an antiseptic cleaner which kills germs and bonds with the skin to continue killing germs even after washing. Please DO NOT use if you have an allergy to CHG or antibacterial soaps.  If your skin becomes reddened/irritated stop using the CHG and inform your nurse when you arrive at Short Stay. Do not shave (including legs and underarms) for at least 48 hours prior to the first CHG shower.  You may shave your  face/neck. Please follow these instructions carefully:  1.  Shower with CHG Soap the night before surgery and the  morning of Surgery.  2.  If you choose to wash your hair, wash your hair first as usual with your  normal  shampoo.  3.  After you shampoo, rinse your hair and body thoroughly to remove the  shampoo.                            4.  Use CHG as you would any other liquid soap.  You can apply chg directly  to the skin and wash  Gently with a scrungie or clean washcloth.  5.  Apply the CHG Soap to your body ONLY FROM THE NECK DOWN.   Do not use on face/ open                           Wound or open sores. Avoid contact with eyes, ears mouth and genitals (private parts).                       Wash face,  Genitals (private parts) with your normal soap.             6.  Wash thoroughly, paying special attention to the area where your surgery  will be performed.  7.  Thoroughly rinse your body with warm water from the neck down.  8.  DO NOT shower/wash with your normal soap after using and rinsing off  the CHG Soap.             9.  Pat yourself dry with a clean towel.            10.  Wear clean pajamas.            11.  Place clean sheets on your bed the night of your first shower and do not  sleep with pets. Day of Surgery : Do not apply any lotions/deodorants the morning of surgery.  Please wear clean clothes to the hospital/surgery center.  FAILURE TO FOLLOW THESE INSTRUCTIONS MAY RESULT IN THE CANCELLATION OF YOUR SURGERY PATIENT SIGNATURE_________________________________  NURSE SIGNATURE__________________________________  ________________________________________________________________________   Adam Phenix  An incentive spirometer is a tool that can help keep your lungs clear and active. This tool measures how well you are filling your lungs with each breath. Taking long deep breaths may help reverse or decrease the chance of developing breathing  (pulmonary) problems (especially infection) following:  A long period of time when you are unable to move or be active. BEFORE THE PROCEDURE   If the spirometer includes an indicator to show your best effort, your nurse or respiratory therapist will set it to a desired goal.  If possible, sit up straight or lean slightly forward. Try not to slouch.  Hold the incentive spirometer in an upright position. INSTRUCTIONS FOR USE  1. Sit on the edge of your bed if possible, or sit up as far as you can in bed or on a chair. 2. Hold the incentive spirometer in an upright position. 3. Breathe out normally. 4. Place the mouthpiece in your mouth and seal your lips tightly around it. 5. Breathe in slowly and as deeply as possible, raising the piston or the ball toward the top of the column. 6. Hold your breath for 3-5 seconds or for as long as possible. Allow the piston or ball to fall to the bottom of the column. 7. Remove the mouthpiece from your mouth and breathe out normally. 8. Rest for a few seconds and repeat Steps 1 through 7 at least 10 times every 1-2 hours when you are awake. Take your time and take a few normal breaths between deep breaths. 9. The spirometer may include an indicator to show your best effort. Use the indicator as a goal to work toward during each repetition. 10. After each set of 10 deep breaths, practice coughing to be sure your lungs are clear. If you have an incision (the cut made at the time of surgery), support your incision  when coughing by placing a pillow or rolled up towels firmly against it. Once you are able to get out of bed, walk around indoors and cough well. You may stop using the incentive spirometer when instructed by your caregiver.  RISKS AND COMPLICATIONS  Take your time so you do not get dizzy or light-headed.  If you are in pain, you may need to take or ask for pain medication before doing incentive spirometry. It is harder to take a deep breath if you  are having pain. AFTER USE  Rest and breathe slowly and easily.  It can be helpful to keep track of a log of your progress. Your caregiver can provide you with a simple table to help with this. If you are using the spirometer at home, follow these instructions: [place] IF:   You are having difficultly using the spirometer.  You have trouble using the spirometer as often as instructed.  Your pain medication is not giving enough relief while using the spirometer.  You develop fever of 100.5 F (38.1 C) or higher. SEEK IMMEDIATE MEDICAL CARE IF:   You cough up bloody sputum that had not been present before.  You develop fever of 102 F (38.9 C) or greater.  You develop worsening pain at or near the incision site. MAKE SURE YOU:   Understand these instructions.  Will watch your condition.  Will get help right away if you are not doing well or get worse. Document Released: 10/20/2006 Document Revised: 09/01/2011 Document Reviewed: 12/21/2006 ExitCare Patient Information 2014 Exit[employer], [place].   ________________________________________________________________________  WHAT IS A BLOOD TRANSFUSION? Blood Transfusion Information  A transfusion is the replacement of blood or some of its parts. Blood is made up of multiple cells which provide different functions.  Red blood cells carry oxygen and are used for blood loss replacement.  White blood cells fight against infection.  Platelets control bleeding.  Plasma helps clot blood.  Other blood products are available for specialized needs, such as hemophilia or other clotting disorders. BEFORE THE TRANSFUSION  Who gives blood for transfusions?   Healthy volunteers who are fully evaluated to make sure their blood is safe. This is blood bank blood. Transfusion therapy is the safest it has ever been in the practice of medicine. Before blood is taken from a donor, a complete history is taken to make sure that person has  no history of diseases nor engages in risky social behavior (examples are intravenous drug use or sexual activity with multiple partners). The donor's travel history is screened to minimize risk of transmitting infections, such as malaria. The donated blood is tested for signs of infectious diseases, such as HIV and hepatitis. The blood is then tested to be sure it is compatible with you in order to minimize the chance of a transfusion reaction. If you or a relative donates blood, this is often done in anticipation of surgery and is not appropriate for emergency situations. It takes many days to process the donated blood. RISKS AND COMPLICATIONS Although transfusion therapy is very safe and saves many lives, the main dangers of transfusion include:   Getting an infectious disease.  Developing a transfusion reaction. This is an allergic reaction to something in the blood you were given. Every precaution is taken to prevent this. The decision to have a blood transfusion has been considered carefully by your caregiver before blood is given. Blood is not given unless the benefits outweigh the risks. AFTER THE TRANSFUSION  Right after  receiving a blood transfusion, you will usually feel much better and more energetic. This is especially true if your red blood cells have gotten low (anemic). The transfusion raises the level of the red blood cells which carry oxygen, and this usually causes an energy increase.  The nurse administering the transfusion will monitor you carefully for complications. HOME CARE INSTRUCTIONS  No special instructions are needed after a transfusion. You may find your energy is better. Speak with your caregiver about any limitations on activity for underlying diseases you may have. SEEK MEDICAL CARE IF:   Your condition is not improving after your transfusion.  You develop redness or irritation at the intravenous (IV) site. SEEK IMMEDIATE MEDICAL CARE IF:  Any of the following  symptoms occur over the next 12 hours:  Shaking chills.  You have a temperature by mouth above 102 F (38.9 C), not controlled by medicine.  Chest, back, or muscle pain.  People around you feel you are not acting correctly or are confused.  Shortness of breath or difficulty breathing.  Dizziness and fainting.  You get a rash or develop hives.  You have a decrease in urine output.  Your urine turns a dark color or changes to pink, red, or brown. Any of the following symptoms occur over the next 10 days:  You have a temperature by mouth above 102 F (38.9 C), not controlled by medicine.  Shortness of breath.  Weakness after normal activity.  The white part of the eye turns yellow (jaundice).  You have a decrease in the amount of urine or are urinating less often.  Your urine turns a dark color or changes to pink, red, or brown. Document Released: 06/06/2000 Document Revised: 09/01/2011 Document Reviewed: 01/24/2008 [facility] Patient Information 2014 [place], [place].  _______________________________________________________________________

## 2018-02-15 ENCOUNTER — Encounter (HOSPITAL_COMMUNITY)
Admission: RE | Admit: 2018-02-15 | Discharge: 2018-02-15 | Disposition: A | Discharge status: Home / Self Care | Payer: Medicare PPO | Source: Ambulatory Visit | Attending: Orthopedic Surgery | Admitting: Orthopedic Surgery

## 2018-02-15 ENCOUNTER — Other Ambulatory Visit: Payer: Self-pay

## 2018-02-15 ENCOUNTER — Encounter (HOSPITAL_COMMUNITY): Payer: Self-pay

## 2018-02-15 DIAGNOSIS — M1611 Unilateral primary osteoarthritis, right hip: Secondary | ICD-10-CM | POA: Diagnosis not present

## 2018-02-15 DIAGNOSIS — Z01812 Encounter for preprocedural laboratory examination: Secondary | ICD-10-CM | POA: Insufficient documentation

## 2018-02-15 HISTORY — DX: Unspecified osteoarthritis, unspecified site: M19.90

## 2018-02-15 HISTORY — DX: Benign prostatic hyperplasia without lower urinary tract symptoms: N40.0

## 2018-02-15 LAB — SURGICAL PCR SCREEN
MRSA, PCR: NEGATIVE
Staphylococcus aureus: NEGATIVE

## 2018-02-15 LAB — CBC
HCT: 41 % (ref 39.0–52.0)
Hemoglobin: 13.4 g/dL (ref 13.0–17.0)
MCH: 30.6 pg (ref 26.0–34.0)
MCHC: 32.7 g/dL (ref 30.0–36.0)
MCV: 93.6 fL (ref 78.0–100.0)
PLATELETS: 257 10*3/uL (ref 150–400)
RBC: 4.38 MIL/uL (ref 4.22–5.81)
RDW: 13.9 % (ref 11.5–15.5)
WBC: 7.3 10*3/uL (ref 4.0–10.5)

## 2018-02-15 NOTE — Progress Notes (Signed)
Current ekg dated 11-20-2017 in epic.

## 2018-02-16 NOTE — H&P (Signed)
TOTAL HIP ADMISSION H&P  Patient is admitted for right total hip arthroplasty, anterior approach.  Subjective:  Chief Complaint:     Right hip primary OA / pain  HPI: James Mendez, 62 y.o. male, has a history of pain and functional disability in the right hip(s) due to arthritis and patient has failed non-surgical conservative treatments for greater than 12 weeks to include NSAID's and/or analgesics, corticosteriod injections, use of assistive devices and activity modification.  Onset of symptoms was gradual starting  years ago with gradually worsening course since that time.The patient noted prior procedures of the hip to include arthroplasty on the left hip on Nov 23, 2017 per Dr. Charlann Boxer.  Patient currently rates pain in the right hip at 10 out of 10 with activity. Patient has night pain, worsening of pain with activity and weight bearing, trendelenberg gait, pain that interfers with activities of daily living and pain with passive range of motion. Patient has evidence of periarticular osteophytes and joint space narrowing by imaging studies. This condition presents safety issues increasing the risk of falls.  There is no current active infection.  Risks, benefits and expectations were discussed with the patient.  Risks including but not limited to the risk of anesthesia, blood clots, nerve damage, blood vessel damage, failure of the prosthesis, infection and up to and including death.  Patient understand the risks, benefits and expectations and wishes to proceed with surgery.   PCP: System, Pcp Not In  D/C Plans:       Home   Post-op Meds:       No Rx given   Tranexamic Acid:      To be given - IV   Decadron:      Is to be given  FYI:     ASA  Norco  Robaxin  DME:   Pt already has equipment  PT:    No PT    Patient Active Problem List   Diagnosis Date Noted  . Obese 11/25/2017  . 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 BPH (benign prostatic  hyperplasia)   . GERD (gastroesophageal reflux disease)   . Hypertension   . OA (osteoarthritis)    HIPS , KNEES    Past Surgical History:  Procedure Laterality Date  . CATARACT EXTRACTION W/ INTRAOCULAR LENS  IMPLANT, BILATERAL  2013  . TOTAL HIP ARTHROPLASTY Left 11/24/2017   Procedure: LEFT TOTAL HIP ARTHROPLASTY ANTERIOR APPROACH;  Surgeon: Durene Romans, MD;  Location: [facility];  Service: Orthopedics;  Laterality: Left;  70 mins    No current facility-administered medications for this encounter.    Current Outpatient Medications  Medication Sig Dispense Refill Last Dose  . amLODipine (NORVASC) 5 MG tablet Take 5 mg by mouth every morning.    11/24/2017 at 0530  . finasteride (PROSCAR) 5 MG tablet Take 5 mg by mouth daily.   11/24/2017 at 0530  . gabapentin (NEURONTIN) 300 MG capsule Take 300 mg by mouth 3 (three) times daily.    11/23/2017 at Unknown time  . Lidocaine 5 % CREA Apply 1 application topically daily as needed (pain).   More than a month at Unknown time  . losartan (COZAAR) 50 MG tablet Take 50 mg by mouth every morning.    11/23/2017 at Unknown time  . methocarbamol (ROBAXIN) 500 MG tablet Take 1 tablet (500 mg total) by mouth every 6 (six) hours as needed for muscle spasms. 40 tablet 0   .  metoprolol tartrate (LOPRESSOR) 25 MG tablet Take 25 mg by mouth 2 (two) times daily.   11/24/2017 at 0530  . pantoprazole (PROTONIX) 40 MG tablet Take 40 mg by mouth every morning.    11/24/2017 at 0530  . tamsulosin (FLOMAX) 0.4 MG CAPS capsule Take 0.4 mg by mouth at bedtime.   11/24/2017 at 0530  . traMADol (ULTRAM) 50 MG tablet Take 50 mg by mouth every 6 (six) hours as needed for moderate pain.      Allergies  Allergen Reactions  . Adhesive [Tape] Rash    Itching    Social History   Tobacco Use  . Smoking status: Current Every Day Smoker    Packs/day: 1.00    Years: 40.00    Pack years: 40.00    Types: Cigarettes  . Smokeless tobacco: Never Used  Substance Use Topics  . Alcohol use:  Yes    Alcohol/week: 6.0 standard drinks    Types: 6 Cans of beer per week    Comment: daily        Review of Systems  Constitutional: Negative.   HENT: Negative.   Eyes: Negative.   Respiratory: Positive for wheezing.   Cardiovascular: Negative.   Gastrointestinal: Positive for heartburn.  Genitourinary: Positive for frequency and urgency.  Musculoskeletal: Positive for joint pain.  Skin: Negative.   Neurological: Negative.   Endo/Heme/Allergies: Negative.   Psychiatric/Behavioral: Negative.     Objective:  Physical Exam  Constitutional: He is oriented to person, place, and time. He appears well-developed.  HENT:  Head: Normocephalic.  Eyes: Pupils are equal, round, and reactive to light.  Neck: Neck supple. No JVD present. No tracheal deviation present. No thyromegaly present.  Cardiovascular: Normal rate, regular rhythm and intact distal pulses.  Respiratory: Effort normal and breath sounds normal. No respiratory distress. He has no wheezes.  GI: Soft. There is no tenderness. There is no guarding.  Musculoskeletal:       Right hip: He exhibits decreased range of motion, decreased strength, tenderness and bony tenderness. He exhibits no swelling, no deformity and no laceration.  Lymphadenopathy:    He has no cervical adenopathy.  Neurological: He is alert and oriented to person, place, and time.  Skin: Skin is warm and dry.  Psychiatric: He has a normal mood and affect.    Vital signs in last 24 hours: Temp:  [98.5 F (36.9 C)] 98.5 F (36.9 C) (08/26 1520) Pulse Rate:  [67] 67 (08/26 1520) Resp:  [16] 16 (08/26 1520) BP: (134)/(62) 134/62 (08/26 1520) SpO2:  [98 %] 98 % (08/26 1520) Weight:  [84.9 kg] 84.9 kg (08/26 1520)  Labs:   Estimated body mass index is 29.31 kg/m as calculated from the following:   Height as of 02/15/18: 5\' 7"  (1.702 m).   Weight as of 02/15/18: 84.9 kg.   Imaging Review Plain radiographs demonstrate severe degenerative joint  disease of the right hip(s). The bone quality appears to be good for age and reported activity level.    Preoperative templating of the joint replacement has been completed, documented, and submitted to the Operating Room personnel in order to optimize intra-operative equipment management.     Assessment/Plan:  End stage arthritis, right hip  The patient history, physical examination, clinical judgement of the provider and imaging studies are consistent with end stage degenerative joint disease of the right hip and total hip arthroplasty is deemed medically necessary. The treatment options including medical management, injection therapy, arthroscopy and arthroplasty were discussed at length.  The risks and benefits of total hip arthroplasty were presented and reviewed. The risks due to aseptic loosening, infection, stiffness, dislocation/subluxation,  thromboembolic complications and other imponderables were discussed.  The patient acknowledged the explanation, agreed to proceed with the plan and consent was signed. Patient is being admitted for inpatient treatment for surgery, pain control, PT, OT, prophylactic antibiotics, VTE prophylaxis, progressive ambulation and ADL's and discharge planning.The patient is planning to be discharged home.     Anastasio AuerbachMatthew S. Jaylah Goodlow   PA-C  02/16/2018, 2:43 PM

## 2018-02-18 NOTE — Progress Notes (Signed)
Called and spoke w/ pt via phone about his surgery start time has changed to 0715.  Pt verbalized understanding to arrive at 02-23-2018 at 0530.

## 2018-02-22 ENCOUNTER — Encounter (HOSPITAL_COMMUNITY): Payer: Self-pay | Admitting: Anesthesiology

## 2018-02-22 NOTE — Anesthesia Preprocedure Evaluation (Addendum)
Anesthesia Evaluation  Patient identified by MRN, date of birth, ID band Patient awake    Reviewed: Allergy & Precautions, NPO status , Patient's Chart, lab work & pertinent test results  Airway Mallampati: II  TM Distance: >3 FB Neck ROM: Full    Dental  (+) Edentulous Upper, Poor Dentition   Pulmonary Current Smoker,    breath sounds clear to auscultation       Cardiovascular hypertension, Pt. on medications and Pt. on home beta blockers  Rhythm:Regular Rate:Normal     Neuro/Psych negative neurological ROS  negative psych ROS   GI/Hepatic Neg liver ROS, GERD  Medicated,  Endo/Other  negative endocrine ROS  Renal/GU negative Renal ROS     Musculoskeletal  (+) Arthritis ,   Abdominal Normal abdominal exam  (+)   Peds  Hematology negative hematology ROS (+)   Anesthesia Other Findings   Reproductive/Obstetrics                            Lab Results  Component Value Date   WBC 7.3 02/15/2018   HGB 13.4 02/15/2018   HCT 41.0 02/15/2018   MCV 93.6 02/15/2018   PLT 257 02/15/2018   ECG: NSR  Anesthesia Physical Anesthesia Plan  ASA: II  Anesthesia Plan: Spinal   Post-op Pain Management:    Induction: Intravenous  PONV Risk Score and Plan: 1 and Propofol infusion and Midazolam  Airway Management Planned: Simple Face Mask  Additional Equipment: None  Intra-op Plan:   Post-operative Plan:   Informed Consent: I have reviewed the patients History and Physical, chart, labs and discussed the procedure including the risks, benefits and alternatives for the proposed anesthesia with the patient or authorized representative who has indicated his/her understanding and acceptance.   Dental advisory given  Plan Discussed with: CRNA  Anesthesia Plan Comments:        Anesthesia Quick Evaluation

## 2018-02-23 ENCOUNTER — Other Ambulatory Visit: Payer: Self-pay

## 2018-02-23 ENCOUNTER — Encounter (HOSPITAL_COMMUNITY): Payer: Self-pay | Admitting: *Deleted

## 2018-02-23 ENCOUNTER — Inpatient Hospital Stay (HOSPITAL_COMMUNITY): Payer: Medicare PPO

## 2018-02-23 ENCOUNTER — Inpatient Hospital Stay (HOSPITAL_COMMUNITY): Payer: Medicare PPO | Admitting: Anesthesiology

## 2018-02-23 ENCOUNTER — Encounter (HOSPITAL_COMMUNITY)
Admission: RE | Disposition: A | Discharge status: Home / Self Care | Payer: Self-pay | Source: Home / Self Care | Attending: Orthopedic Surgery

## 2018-02-23 ENCOUNTER — Inpatient Hospital Stay (HOSPITAL_COMMUNITY)
Admission: RE | Admit: 2018-02-23 | Discharge: 2018-02-24 | DRG: 470 | Disposition: A | Discharge status: Home / Self Care | Payer: Medicare PPO | Attending: Orthopedic Surgery | Admitting: Orthopedic Surgery

## 2018-02-23 DIAGNOSIS — E663 Overweight: Secondary | ICD-10-CM | POA: Diagnosis present

## 2018-02-23 DIAGNOSIS — Z96642 Presence of left artificial hip joint: Secondary | ICD-10-CM | POA: Diagnosis present

## 2018-02-23 DIAGNOSIS — M1611 Unilateral primary osteoarthritis, right hip: Secondary | ICD-10-CM | POA: Diagnosis present

## 2018-02-23 DIAGNOSIS — Z96649 Presence of unspecified artificial hip joint: Secondary | ICD-10-CM

## 2018-02-23 DIAGNOSIS — N400 Enlarged prostate without lower urinary tract symptoms: Secondary | ICD-10-CM | POA: Diagnosis present

## 2018-02-23 DIAGNOSIS — Z6829 Body mass index (BMI) 29.0-29.9, adult: Secondary | ICD-10-CM

## 2018-02-23 DIAGNOSIS — Z79899 Other long term (current) drug therapy: Secondary | ICD-10-CM

## 2018-02-23 DIAGNOSIS — K219 Gastro-esophageal reflux disease without esophagitis: Secondary | ICD-10-CM | POA: Diagnosis present

## 2018-02-23 DIAGNOSIS — Z91048 Other nonmedicinal substance allergy status: Secondary | ICD-10-CM

## 2018-02-23 DIAGNOSIS — F17210 Nicotine dependence, cigarettes, uncomplicated: Secondary | ICD-10-CM | POA: Diagnosis present

## 2018-02-23 DIAGNOSIS — M25551 Pain in right hip: Secondary | ICD-10-CM | POA: Diagnosis present

## 2018-02-23 DIAGNOSIS — I10 Essential (primary) hypertension: Secondary | ICD-10-CM | POA: Diagnosis present

## 2018-02-23 HISTORY — PX: TOTAL HIP ARTHROPLASTY: SHX124

## 2018-02-23 LAB — TYPE AND SCREEN
ABO/RH(D): A POS
Antibody Screen: NEGATIVE

## 2018-02-23 SURGERY — ARTHROPLASTY, HIP, TOTAL, ANTERIOR APPROACH
Anesthesia: Spinal | Site: Hip | Laterality: Right

## 2018-02-23 MED ORDER — LACTATED RINGERS IV SOLN: INTRAVENOUS | Status: DC

## 2018-02-23 MED ORDER — PHENOL 1.4 % MT LIQD
1.0000 | OROMUCOSAL | Status: DC | PRN
  Filled 2018-02-23: qty 177

## 2018-02-23 MED ORDER — PROPOFOL 10 MG/ML IV BOLUS
INTRAVENOUS | Status: AC
  Filled 2018-02-23: qty 20

## 2018-02-23 MED ORDER — FENTANYL CITRATE (PF) 100 MCG/2ML IJ SOLN
INTRAMUSCULAR | Status: DC | PRN
  Administered 2018-02-23: 50 ug via INTRAVENOUS

## 2018-02-23 MED ORDER — DEXAMETHASONE SODIUM PHOSPHATE 10 MG/ML IJ SOLN
10.0000 mg | Freq: Once | INTRAMUSCULAR | Status: AC
  Administered 2018-02-24: 10 mg via INTRAVENOUS
  Filled 2018-02-23: qty 1

## 2018-02-23 MED ORDER — CEFAZOLIN SODIUM-DEXTROSE 2-4 GM/100ML-% IV SOLN
2.0000 g | Freq: Four times a day (QID) | INTRAVENOUS | Status: AC
  Administered 2018-02-23 (×2): 2 g via INTRAVENOUS
  Filled 2018-02-23 (×2): qty 100

## 2018-02-23 MED ORDER — HYDROMORPHONE HCL 1 MG/ML IJ SOLN
INTRAMUSCULAR | Status: AC
  Filled 2018-02-23: qty 1

## 2018-02-23 MED ORDER — BISACODYL 10 MG RE SUPP: 10.0000 mg | Freq: Every day | RECTAL | Status: DC | PRN

## 2018-02-23 MED ORDER — ONDANSETRON HCL 4 MG PO TABS: 4.0000 mg | ORAL_TABLET | Freq: Four times a day (QID) | ORAL | Status: DC | PRN

## 2018-02-23 MED ORDER — HYDROMORPHONE HCL 1 MG/ML IJ SOLN
0.2500 mg | INTRAMUSCULAR | Status: DC | PRN
  Administered 2018-02-23 (×4): 0.5 mg via INTRAVENOUS

## 2018-02-23 MED ORDER — CELECOXIB 200 MG PO CAPS
200.0000 mg | ORAL_CAPSULE | Freq: Two times a day (BID) | ORAL | Status: DC
  Administered 2018-02-23 – 2018-02-24 (×3): 200 mg via ORAL
  Filled 2018-02-23 (×3): qty 1

## 2018-02-23 MED ORDER — METHOCARBAMOL 500 MG IVPB - SIMPLE MED
500.0000 mg | Freq: Four times a day (QID) | INTRAVENOUS | Status: DC | PRN
  Administered 2018-02-23: 500 mg via INTRAVENOUS
  Filled 2018-02-23: qty 50

## 2018-02-23 MED ORDER — METOPROLOL TARTRATE 25 MG PO TABS
25.0000 mg | ORAL_TABLET | Freq: Two times a day (BID) | ORAL | Status: DC
  Administered 2018-02-23 – 2018-02-24 (×2): 25 mg via ORAL
  Filled 2018-02-23 (×2): qty 1

## 2018-02-23 MED ORDER — HYDROCODONE-ACETAMINOPHEN 5-325 MG PO TABS: 1.0000 | ORAL_TABLET | ORAL | Status: DC | PRN

## 2018-02-23 MED ORDER — DEXAMETHASONE SODIUM PHOSPHATE 10 MG/ML IJ SOLN
10.0000 mg | Freq: Once | INTRAMUSCULAR | Status: AC
  Administered 2018-02-23: 10 mg via INTRAVENOUS

## 2018-02-23 MED ORDER — ALUM & MAG HYDROXIDE-SIMETH 200-200-20 MG/5ML PO SUSP: 15.0000 mL | ORAL | Status: DC | PRN

## 2018-02-23 MED ORDER — MEPERIDINE HCL 50 MG/ML IJ SOLN: 6.2500 mg | INTRAMUSCULAR | Status: DC | PRN

## 2018-02-23 MED ORDER — SODIUM CHLORIDE 0.9 % IV SOLN
INTRAVENOUS | Status: DC | PRN
  Administered 2018-02-23: 25 ug/min via INTRAVENOUS

## 2018-02-23 MED ORDER — METOCLOPRAMIDE HCL 5 MG PO TABS: 5.0000 mg | ORAL_TABLET | Freq: Three times a day (TID) | ORAL | Status: DC | PRN

## 2018-02-23 MED ORDER — SODIUM CHLORIDE 0.9 % IR SOLN
Status: DC | PRN
  Administered 2018-02-23: 1000 mL

## 2018-02-23 MED ORDER — METHOCARBAMOL 500 MG PO TABS
500.0000 mg | ORAL_TABLET | Freq: Four times a day (QID) | ORAL | Status: DC | PRN
  Administered 2018-02-23 – 2018-02-24 (×2): 500 mg via ORAL
  Filled 2018-02-23 (×3): qty 1

## 2018-02-23 MED ORDER — DIPHENHYDRAMINE HCL 12.5 MG/5ML PO ELIX: 12.5000 mg | ORAL_SOLUTION | ORAL | Status: DC | PRN

## 2018-02-23 MED ORDER — STERILE WATER FOR IRRIGATION IR SOLN
Status: DC | PRN
  Administered 2018-02-23: 2000 mL

## 2018-02-23 MED ORDER — TAMSULOSIN HCL 0.4 MG PO CAPS: 0.4000 mg | ORAL_CAPSULE | Freq: Every day | ORAL | Status: DC

## 2018-02-23 MED ORDER — ASPIRIN 81 MG PO CHEW
81.0000 mg | CHEWABLE_TABLET | Freq: Two times a day (BID) | ORAL | Status: DC
  Administered 2018-02-23 – 2018-02-24 (×2): 81 mg via ORAL
  Filled 2018-02-23 (×2): qty 1

## 2018-02-23 MED ORDER — DEXAMETHASONE SODIUM PHOSPHATE 10 MG/ML IJ SOLN
INTRAMUSCULAR | Status: AC
  Filled 2018-02-23: qty 1

## 2018-02-23 MED ORDER — PROPOFOL 500 MG/50ML IV EMUL
INTRAVENOUS | Status: DC | PRN
  Administered 2018-02-23: 75 ug/kg/min via INTRAVENOUS

## 2018-02-23 MED ORDER — CEFAZOLIN SODIUM-DEXTROSE 2-4 GM/100ML-% IV SOLN
2.0000 g | INTRAVENOUS | Status: AC
  Administered 2018-02-23: 2 g via INTRAVENOUS
  Filled 2018-02-23: qty 100

## 2018-02-23 MED ORDER — FINASTERIDE 5 MG PO TABS
5.0000 mg | ORAL_TABLET | Freq: Every day | ORAL | Status: DC
  Administered 2018-02-23 – 2018-02-24 (×2): 5 mg via ORAL
  Filled 2018-02-23 (×2): qty 1

## 2018-02-23 MED ORDER — TRANEXAMIC ACID 1000 MG/10ML IV SOLN
1000.0000 mg | Freq: Once | INTRAVENOUS | Status: AC
  Administered 2018-02-23: 1000 mg via INTRAVENOUS
  Filled 2018-02-23: qty 1000

## 2018-02-23 MED ORDER — ACETAMINOPHEN 10 MG/ML IV SOLN
INTRAVENOUS | Status: AC
  Filled 2018-02-23: qty 100

## 2018-02-23 MED ORDER — DOCUSATE SODIUM 100 MG PO CAPS
100.0000 mg | ORAL_CAPSULE | Freq: Two times a day (BID) | ORAL | Status: DC
  Administered 2018-02-23 – 2018-02-24 (×2): 100 mg via ORAL
  Filled 2018-02-23 (×2): qty 1

## 2018-02-23 MED ORDER — MIDAZOLAM HCL 2 MG/2ML IJ SOLN
INTRAMUSCULAR | Status: AC
  Filled 2018-02-23: qty 2

## 2018-02-23 MED ORDER — LOSARTAN POTASSIUM 50 MG PO TABS
50.0000 mg | ORAL_TABLET | Freq: Every day | ORAL | Status: DC
  Administered 2018-02-23 – 2018-02-24 (×2): 50 mg via ORAL
  Filled 2018-02-23 (×2): qty 1

## 2018-02-23 MED ORDER — HYDROMORPHONE HCL 1 MG/ML IJ SOLN
0.5000 mg | INTRAMUSCULAR | Status: DC | PRN
  Administered 2018-02-23: 0.5 mg via INTRAVENOUS
  Filled 2018-02-23: qty 1

## 2018-02-23 MED ORDER — MIDAZOLAM HCL 5 MG/5ML IJ SOLN
INTRAMUSCULAR | Status: DC | PRN
  Administered 2018-02-23: 2 mg via INTRAVENOUS

## 2018-02-23 MED ORDER — GABAPENTIN 300 MG PO CAPS
300.0000 mg | ORAL_CAPSULE | Freq: Three times a day (TID) | ORAL | Status: DC
  Administered 2018-02-23 – 2018-02-24 (×3): 300 mg via ORAL
  Filled 2018-02-23 (×3): qty 1

## 2018-02-23 MED ORDER — CHLORHEXIDINE GLUCONATE 4 % EX LIQD: 60.0000 mL | Freq: Once | CUTANEOUS | Status: DC

## 2018-02-23 MED ORDER — METHOCARBAMOL 500 MG IVPB - SIMPLE MED
INTRAVENOUS | Status: AC
  Filled 2018-02-23: qty 50

## 2018-02-23 MED ORDER — LACTATED RINGERS IV SOLN
INTRAVENOUS | Status: DC
  Administered 2018-02-23 (×2): via INTRAVENOUS

## 2018-02-23 MED ORDER — MAGNESIUM CITRATE PO SOLN: 1.0000 | Freq: Once | ORAL | Status: DC | PRN

## 2018-02-23 MED ORDER — SODIUM CHLORIDE 0.9 % IV SOLN
1000.0000 mg | INTRAVENOUS | Status: AC
  Administered 2018-02-23: 1000 mg via INTRAVENOUS
  Filled 2018-02-23: qty 10

## 2018-02-23 MED ORDER — ONDANSETRON HCL 4 MG/2ML IJ SOLN: 4.0000 mg | Freq: Four times a day (QID) | INTRAMUSCULAR | Status: DC | PRN

## 2018-02-23 MED ORDER — ONDANSETRON HCL 4 MG/2ML IJ SOLN
INTRAMUSCULAR | Status: AC
  Filled 2018-02-23: qty 2

## 2018-02-23 MED ORDER — ONDANSETRON HCL 4 MG/2ML IJ SOLN
INTRAMUSCULAR | Status: DC | PRN
  Administered 2018-02-23: 4 mg via INTRAVENOUS

## 2018-02-23 MED ORDER — PROPOFOL 10 MG/ML IV BOLUS
INTRAVENOUS | Status: AC
  Filled 2018-02-23: qty 40

## 2018-02-23 MED ORDER — PANTOPRAZOLE SODIUM 40 MG PO TBEC
40.0000 mg | DELAYED_RELEASE_TABLET | Freq: Every day | ORAL | Status: DC
  Administered 2018-02-23 – 2018-02-24 (×2): 40 mg via ORAL
  Filled 2018-02-23 (×2): qty 1

## 2018-02-23 MED ORDER — HYDROCODONE-ACETAMINOPHEN 7.5-325 MG PO TABS
1.0000 | ORAL_TABLET | ORAL | Status: DC | PRN
  Administered 2018-02-23: 2 via ORAL
  Administered 2018-02-23 (×2): 1 via ORAL
  Administered 2018-02-23 – 2018-02-24 (×3): 2 via ORAL
  Filled 2018-02-23 (×2): qty 1
  Filled 2018-02-23 (×4): qty 2

## 2018-02-23 MED ORDER — ACETAMINOPHEN 10 MG/ML IV SOLN
1000.0000 mg | Freq: Once | INTRAVENOUS | Status: AC
  Administered 2018-02-23: 1000 mg via INTRAVENOUS

## 2018-02-23 MED ORDER — ACETAMINOPHEN 325 MG PO TABS: 325.0000 mg | ORAL_TABLET | Freq: Four times a day (QID) | ORAL | Status: DC | PRN

## 2018-02-23 MED ORDER — AMLODIPINE BESYLATE 5 MG PO TABS
5.0000 mg | ORAL_TABLET | Freq: Every day | ORAL | Status: DC
  Administered 2018-02-24: 5 mg via ORAL
  Filled 2018-02-23: qty 1

## 2018-02-23 MED ORDER — MENTHOL 3 MG MT LOZG: 1.0000 | LOZENGE | OROMUCOSAL | Status: DC | PRN

## 2018-02-23 MED ORDER — FERROUS SULFATE 325 (65 FE) MG PO TABS
325.0000 mg | ORAL_TABLET | Freq: Three times a day (TID) | ORAL | Status: DC
  Administered 2018-02-23 – 2018-02-24 (×2): 325 mg via ORAL
  Filled 2018-02-23 (×2): qty 1

## 2018-02-23 MED ORDER — METOCLOPRAMIDE HCL 5 MG/ML IJ SOLN: 5.0000 mg | Freq: Three times a day (TID) | INTRAMUSCULAR | Status: DC | PRN

## 2018-02-23 MED ORDER — SODIUM CHLORIDE 0.9 % IV SOLN
INTRAVENOUS | Status: DC
  Administered 2018-02-23 (×2): via INTRAVENOUS

## 2018-02-23 MED ORDER — BUPIVACAINE IN DEXTROSE 0.75-8.25 % IT SOLN
INTRATHECAL | Status: DC | PRN
  Administered 2018-02-23: 2 mL via INTRATHECAL

## 2018-02-23 MED ORDER — FENTANYL CITRATE (PF) 100 MCG/2ML IJ SOLN
INTRAMUSCULAR | Status: AC
  Filled 2018-02-23: qty 2

## 2018-02-23 MED ORDER — PROMETHAZINE HCL 25 MG/ML IJ SOLN: 6.2500 mg | INTRAMUSCULAR | Status: DC | PRN

## 2018-02-23 MED ORDER — POLYETHYLENE GLYCOL 3350 17 G PO PACK
17.0000 g | PACK | Freq: Two times a day (BID) | ORAL | Status: DC
  Administered 2018-02-23 – 2018-02-24 (×2): 17 g via ORAL
  Filled 2018-02-23 (×2): qty 1

## 2018-02-23 SURGICAL SUPPLY — 41 items
BAG DECANTER FOR FLEXI CONT (MISCELLANEOUS) IMPLANT
BAG ZIPLOCK 12X15 (MISCELLANEOUS) IMPLANT
BLADE SAG 18X100X1.27 (BLADE) ×3 IMPLANT
COVER PERINEAL POST (MISCELLANEOUS) ×3 IMPLANT
COVER SURGICAL LIGHT HANDLE (MISCELLANEOUS) ×3 IMPLANT
CUP ACET PINNACLE SECTR 56MM (Hips) IMPLANT
DERMABOND ADVANCED (GAUZE/BANDAGES/DRESSINGS) ×2
DERMABOND ADVANCED .7 DNX12 (GAUZE/BANDAGES/DRESSINGS) ×1 IMPLANT
DRAPE STERI IOBAN 125X83 (DRAPES) ×3 IMPLANT
DRAPE U-SHAPE 47X51 STRL (DRAPES) ×6 IMPLANT
DRESSING AQUACEL AG SP 3.5X10 (GAUZE/BANDAGES/DRESSINGS) ×1 IMPLANT
DRSG AQUACEL AG SP 3.5X10 (GAUZE/BANDAGES/DRESSINGS) ×3
DURAPREP 26ML APPLICATOR (WOUND CARE) ×3 IMPLANT
ELECT REM PT RETURN 15FT ADLT (MISCELLANEOUS) ×3 IMPLANT
ELIMINATOR HOLE APEX DEPUY (Hips) ×2 IMPLANT
GLOVE BIOGEL M STRL SZ7.5 (GLOVE) IMPLANT
GLOVE BIOGEL PI IND STRL 7.5 (GLOVE) ×1 IMPLANT
GLOVE BIOGEL PI IND STRL 8.5 (GLOVE) ×1 IMPLANT
GLOVE BIOGEL PI INDICATOR 7.5 (GLOVE) ×2
GLOVE BIOGEL PI INDICATOR 8.5 (GLOVE) ×2
GLOVE ECLIPSE 8.0 STRL XLNG CF (GLOVE) ×6 IMPLANT
GLOVE ORTHO TXT STRL SZ7.5 (GLOVE) ×3 IMPLANT
GOWN STRL REUS W/TWL 2XL LVL3 (GOWN DISPOSABLE) ×3 IMPLANT
GOWN STRL REUS W/TWL LRG LVL3 (GOWN DISPOSABLE) ×3 IMPLANT
HEAD CERAMIC DELTA 36 PLUS 1.5 (Hips) ×2 IMPLANT
HOLDER FOLEY CATH W/STRAP (MISCELLANEOUS) ×3 IMPLANT
PACK ANTERIOR HIP CUSTOM (KITS) ×3 IMPLANT
PINNACLE ALTRX PLUS 4 N 36X56 (Hips) ×2 IMPLANT
PINNACLE SECTOR CUP 56MM (Hips) ×3 IMPLANT
SCREW 6.5MMX30MM (Screw) ×2 IMPLANT
STEM TRI LOC BPS SZ7 W GRIPTON (Hips) IMPLANT
SUT MNCRL AB 4-0 PS2 18 (SUTURE) ×3 IMPLANT
SUT STRATAFIX 0 PDS 27 VIOLET (SUTURE) ×3
SUT VIC AB 1 CT1 36 (SUTURE) ×9 IMPLANT
SUT VIC AB 2-0 CT1 27 (SUTURE) ×4
SUT VIC AB 2-0 CT1 TAPERPNT 27 (SUTURE) ×2 IMPLANT
SUTURE STRATFX 0 PDS 27 VIOLET (SUTURE) ×1 IMPLANT
TRAY FOLEY MTR SLVR 16FR STAT (SET/KITS/TRAYS/PACK) IMPLANT
TRI LOC BPS SZ 7 W GRIPTON (Hips) ×3 IMPLANT
WATER STERILE IRR 1000ML POUR (IV SOLUTION) ×3 IMPLANT
YANKAUER SUCT BULB TIP 10FT TU (MISCELLANEOUS) IMPLANT

## 2018-02-23 NOTE — Evaluation (Signed)
Physical Therapy Evaluation Patient Details Name: James Mendez MRN: [id number] DOB: 1955/10/24 Today's Date: 02/23/2018   History of Present Illness  Pt is a 62 YO male s/p R DA-THA on 02/23/18. PMH includes L DA-THA, BPH, GERD, HTN, OA. Pt with 2 falls, one resulting in pelvic fracture and the other post L THA.   Clinical Impression   Pt s/p R DA-THA on 02/23/18. PMH as listed above. Pt presents with R hip pain, decreased activity tolerance due to pain, and difficulty performing mobility tasks. Pt to benefit from acute PT to address these deficits. Pt ambulated 120 ft with min guard assist with RW today. PT to progress mobility as able. Pt plans to d/c home with HEP. Will continue to follow acutely.     Follow Up Recommendations Follow surgeon's recommendation for DC plan and follow-up therapies;Supervision for mobility/OOB(No PT, HEP )    Equipment Recommendations  None recommended by PT    Recommendations for Other Services       Precautions / Restrictions Precautions Precautions: Fall Restrictions Weight Bearing Restrictions: No Other Position/Activity Restrictions: WBAT       Mobility  Bed Mobility Overal bed mobility: Needs Assistance Bed Mobility: Supine to Sit     Supine to sit: Min assist     General bed mobility comments: Min assist for RLE management. Increased time to perform.   Transfers Overall transfer level: Needs assistance Equipment used: Rolling walker (2 wheeled) Transfers: Sit to/from Stand Sit to Stand: Min assist         General transfer comment: Min assist for power up, pt with self-steadying upon standing. Pt able to weight shift L and R prior to initiating ambulation. Verbal cuing for hand placemment when rising.   Ambulation/Gait Ambulation/Gait assistance: Min guard Gait Distance (Feet): 120 Feet Assistive device: Rolling walker (2 wheeled) Gait Pattern/deviations: Step-to pattern;Decreased stride length;Decreased weight shift to  right;Decreased stance time - right;Antalgic;Trunk flexed Gait velocity: decr   General Gait Details: Min guard for safety, verbal cuing for sequencing.   Stairs            Wheelchair Mobility    Modified Rankin (Stroke Patients Only)       Balance Overall balance assessment: Mild deficits observed, not formally tested                                           Pertinent Vitals/Pain Pain Assessment: 0-10 Pain Score: 8  Pain Location: R hip  Pain Descriptors / Indicators: Sore;Operative site guarding Pain Intervention(s): Limited activity within patient's tolerance;Monitored during session;Ice applied;Premedicated before session    Home Living Family/patient expects to be discharged to:: Private residence Living Arrangements: Spouse/significant other Available Help at Discharge: Family;Available 24 hours/day Type of Home: House Home Access: Stairs to enter Entrance Stairs-Rails: Left Entrance Stairs-Number of Steps: 1 step, then threshold  Home Layout: Able to live on main level with bedroom/bathroom;One level(bedroom in the basement) Home Equipment: Walker - 2 wheels;Crutches      Prior Function Level of Independence: Independent with assistive device(s)         Comments: uses bilat crutches during ambulation      Hand Dominance   Dominant Hand: Right    Extremity/Trunk Assessment   Upper Extremity Assessment Upper Extremity Assessment: Overall WFL for tasks assessed    Lower Extremity Assessment Lower Extremity Assessment: RLE deficits/detail;Overall [facility] for tasks assessed  RLE Deficits / Details: suspected post-surgical R hip weakness; able to perfrom quad sets x10 and ankle pumps x20 RLE Sensation: WNL    Cervical / Trunk Assessment Cervical / Trunk Assessment: Normal  Communication   Communication: No difficulties  Cognition Arousal/Alertness: Awake/alert Behavior During Therapy: WFL for tasks assessed/performed Overall  Cognitive Status: Within Functional Limits for tasks assessed                                        General Comments      Exercises Total Joint Exercises Ankle Circles/Pumps: AROM;20 reps;Seated;Both Quad Sets: AROM;Right;10 reps;Seated   Assessment/Plan    PT Assessment Patient needs continued PT services  PT Problem List Decreased strength;Pain;Decreased range of motion;Decreased activity tolerance;Decreased knowledge of use of DME;Decreased balance;Decreased mobility       PT Treatment Interventions DME instruction;Therapeutic activities;Gait training;Therapeutic exercise;Patient/family education;Stair training;Balance training;Functional mobility training;Neuromuscular re-education    PT Goals (Current goals can be found in the Care Plan section)  Acute Rehab PT Goals PT Goal Formulation: With patient Time For Goal Achievement: 03/09/18 Potential to Achieve Goals: Good    Frequency 7X/week   Barriers to discharge        Co-evaluation               AM-PAC PT "6 Clicks" Daily Activity  Outcome Measure Difficulty turning over in bed (including adjusting bedclothes, sheets and blankets)?: Unable Difficulty moving from lying on back to sitting on the side of the bed? : Unable Difficulty sitting down on and standing up from a chair with arms (e.g., wheelchair, bedside commode, etc,.)?: Unable Help needed moving to and from a bed to chair (including a wheelchair)?: A Little Help needed walking in hospital room?: A Little Help needed climbing 3-5 steps with a railing? : A Lot 6 Click Score: 11    End of Session Equipment Utilized During Treatment: Gait belt Activity Tolerance: Patient tolerated treatment well;No increased pain Patient left: in chair;with chair alarm set;with call bell/phone within reach;with SCD's reapplied Nurse Communication: Mobility status PT Visit Diagnosis: History of falling (Z91.81);Other abnormalities of gait and mobility  (R26.89)    Time: 1610-9604 PT Time Calculation (min) (ACUTE ONLY): 27 min   Charges:   PT Evaluation $PT Eval Low Complexity: 1 Low PT Treatments $Gait Training: 8-22 mins       James Mendez, PT, DPT  Pager # [phone]    James Mendez 02/23/2018, 6:00 PM

## 2018-02-23 NOTE — Anesthesia Procedure Notes (Signed)
Date/Time: 02/23/2018 7:26 AM Performed by: Thornell Mule, CRNA Oxygen Delivery Method: Simple face mask

## 2018-02-23 NOTE — Anesthesia Procedure Notes (Signed)
Spinal  Patient location during procedure: OR Start time: 02/23/2018 7:30 AM End time: 02/23/2018 7:33 AM Staffing Anesthesiologist: Effie Berkshire, MD Resident/CRNA: Glory Buff, CRNA Performed: resident/CRNA  Preanesthetic Checklist Completed: patient identified, site marked, surgical consent, pre-op evaluation, timeout performed, IV checked, risks and benefits discussed and monitors and equipment checked Spinal Block Patient position: sitting Prep: DuraPrep Patient monitoring: continuous pulse ox, heart rate and blood pressure Approach: midline Location: L2-3 Injection technique: single-shot Needle Needle type: Pencan  Needle gauge: 24 G Needle length: 9 cm Needle insertion depth: 5 cm Assessment Sensory level: T6 Additional Notes Kit expiration date checked and verified.  Sitting position, sterile prep and drape, skin local with 1% xylo, stick x 1, - paraesthesia, - heme, + CSF pre and post injection, patient tolerated well.

## 2018-02-23 NOTE — Plan of Care (Signed)
  Problem: Health Behavior/Discharge Planning: Goal: Ability to manage health-related needs will improve Outcome: Progressing   Problem: Clinical Measurements: Goal: Will remain free from infection Outcome: Progressing   Problem: Clinical Measurements: Goal: Diagnostic test results will improve Outcome: Progressing   Problem: Clinical Measurements: Goal: Respiratory complications will improve Outcome: Progressing   

## 2018-02-23 NOTE — H&P (Signed)
TOTAL HIP ADMISSION H&P  Patient is admitted for right total hip arthroplasty.  Subjective:  Chief Complaint: right hip pain  HPI: James Mendez, 62 y.o. male, has a history of pain and functional disability in the right hip(s) due to arthritis and patient has failed non-surgical conservative treatments for greater than 12 weeks to include NSAID's and/or analgesics, flexibility and strengthening excercises, use of assistive devices and activity modification.  Onset of symptoms was gradual starting 4 years ago with gradually worsening course since that time.The patient noted no past surgery on the right hip(s).  Patient currently rates pain in the right hip at 9 out of 10 with activity. Patient has night pain, worsening of pain with activity and weight bearing, trendelenberg gait, pain that interfers with activities of daily living and pain with passive range of motion. Patient has evidence of subchondral cysts, subchondral sclerosis, periarticular osteophytes and joint space narrowing by imaging studies. This condition presents safety issues increasing the risk of falls. This patient has had no previous procedures.  There is no current active infection.  Patient Active Problem List   Diagnosis Date Noted  . Obese 11/25/2017  . 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 BPH (benign prostatic hyperplasia)   . GERD (gastroesophageal reflux disease)   . Hypertension   . OA (osteoarthritis)    HIPS , KNEES    Past Surgical History:  Procedure Laterality Date  . CATARACT EXTRACTION W/ INTRAOCULAR LENS  IMPLANT, BILATERAL  2013  . TOTAL HIP ARTHROPLASTY Left 11/24/2017   Procedure: LEFT TOTAL HIP ARTHROPLASTY ANTERIOR APPROACH;  Surgeon: Durene Romans, MD;  Location: [facility];  Service: Orthopedics;  Laterality: Left;  70 mins    Current Facility-Administered Medications  Medication Dose Route Frequency Provider Last Rate Last Dose  . ceFAZolin (ANCEF) IVPB 2g/100 mL  premix  2 g Intravenous On Call to OR Babish, Molli Hazard, PA-C      . chlorhexidine (HIBICLENS) 4 % liquid 4 application  60 mL Topical Once Babish, Karla Pavone, PA-C      . dexamethasone (DECADRON) injection 10 mg  10 mg Intravenous Once Babish, Mindy Behnken, PA-C      . lactated ringers infusion   Intravenous Continuous Trevor Iha, MD 20 mL/hr at 02/23/18 0940    . tranexamic acid (CYKLOKAPRON) 1,000 mg in sodium chloride 0.9 % 100 mL IVPB  1,000 mg Intravenous To OR Babish, Malissia Rabbani, PA-C       Allergies  Allergen Reactions  . Adhesive [Tape] Rash    Itching    Social History   Tobacco Use  . Smoking status: Current Every Day Smoker    Packs/day: 1.00    Years: 40.00    Pack years: 40.00    Types: Cigarettes  . Smokeless tobacco: Never Used  Substance Use Topics  . Alcohol use: Yes    Alcohol/week: 6.0 standard drinks    Types: 6 Cans of beer per week    Comment: daily     History reviewed. No pertinent family history.   ROS  Objective:  Physical Exam  Vital signs in last 24 hours: Temp:  [98.3 F (36.8 C)] 98.3 F (36.8 C) (09/03 0551) Pulse Rate:  [67] 67 (09/03 0551) Resp:  [16] 16 (09/03 0551) BP: (141)/(62) 141/62 (09/03 0551) SpO2:  [91 %] 91 % (09/03 0551) Weight:  [84.9 kg] 84.9 kg (09/03 0610)  Labs:   Estimated body mass index is 29.31 kg/m as calculated from  the following:   Height as of this encounter: 5\' 7"  (1.702 m).   Weight as of this encounter: 84.9 kg.   Imaging Review Plain radiographs demonstrate severe degenerative joint disease of the right hip(s). The bone quality appears to be good for age and reported activity level.    Preoperative templating of the joint replacement has been completed, documented, and submitted to the Operating Room personnel in order to optimize intra-operative equipment management.     Assessment/Plan:  End stage arthritis, right hip(s)  The patient history, physical examination, clinical judgement of the  provider and imaging studies are consistent with end stage degenerative joint disease of the right hip(s) and total hip arthroplasty is deemed medically necessary. The treatment options including medical management, injection therapy, arthroscopy and arthroplasty were discussed at length. The risks and benefits of total hip arthroplasty were presented and reviewed. The risks due to aseptic loosening, infection, stiffness, dislocation/subluxation,  thromboembolic complications and other imponderables were discussed.  The patient acknowledged the explanation, agreed to proceed with the plan and consent was signed. Patient is being admitted for inpatient treatment for surgery, pain control, PT, OT, prophylactic antibiotics, VTE prophylaxis, progressive ambulation and ADL's and discharge planning.The patient is planning to be discharged home with family support

## 2018-02-23 NOTE — Discharge Instructions (Signed)

## 2018-02-23 NOTE — Anesthesia Postprocedure Evaluation (Signed)
Anesthesia Post Note  Patient: James Mendez  Procedure(s) Performed: RIGHT TOTAL HIP ARTHROPLASTY ANTERIOR APPROACH (Right Hip)     Patient location during evaluation: PACU Anesthesia Type: Spinal Level of consciousness: oriented and awake and alert Pain management: pain level controlled Vital Signs Assessment: post-procedure vital signs reviewed and stable Respiratory status: spontaneous breathing, respiratory function stable and patient connected to nasal cannula oxygen Cardiovascular status: blood pressure returned to baseline and stable Postop Assessment: no headache, no backache, no apparent nausea or vomiting, spinal receding and patient able to bend at knees Anesthetic complications: no    Last Vitals:  Vitals:   02/23/18 1055 02/23/18 1202  BP: 116/76 132/82  Pulse: 63 72  Resp: 16 16  Temp: 36.6 C (!) 36.4 C  SpO2: 92% 97%    Last Pain:  Vitals:   02/23/18 1202  TempSrc: Oral  PainSc:                  [name]

## 2018-02-23 NOTE — Op Note (Signed)
NAME:  James Mendez                ACCOUNT NO.: [id number]      MEDICAL RECORD NO.: [id number]      FACILITY:  [facility]      PHYSICIAN:  Shelda Pal  DATE OF BIRTH:  14-May-1956     DATE OF PROCEDURE:  02/23/2018                                 OPERATIVE REPORT         PREOPERATIVE DIAGNOSIS: Right  hip osteoarthritis.      POSTOPERATIVE DIAGNOSIS:  Right hip osteoarthritis.      PROCEDURE:  Right total hip replacement through an anterior approach   utilizing DePuy THR system, component size 56mm pinnacle cup, a size 36+4 neutral   Altrex liner, a size 7 Hi Tri Lock stem with a 36+1.5 delta ceramic   ball.      SURGEON:  Madlyn Frankel. Charlann Boxer, M.D.      ASSISTANT:  Lanney Gins, PA-C     ANESTHESIA:  Spinal.      SPECIMENS:  None.      COMPLICATIONS:  None.      BLOOD LOSS:  300 cc     DRAINS:  None.      INDICATION OF THE PROCEDURE:  James Mendez is a 62 y.o. male who had   presented to office for evaluation of right hip pain.  Radiographs revealed   progressive degenerative changes with bone-on-bone   articulation of the  hip joint, including subchondral cystic changes and osteophytes.  The patient had painful limited range of   motion significantly affecting their overall quality of life and function.  The patient was failing to    respond to conservative measures including medications and/or injections and activity modification and at this point was ready   to proceed with more definitive measures.  Consent was obtained for   benefit of pain relief.  Specific risks of infection, DVT, component   failure, dislocation, neurovascular injury, and need for revision surgery were reviewed in the office as well discussion of   the anterior versus posterior approach were reviewed.     PROCEDURE IN DETAIL:  The patient was brought to operative theater.   Once adequate anesthesia, preoperative antibiotics, 2 gm of Ancef, 1 gm of Tranexamic Acid, and 10 mg  of Decadron were administered, the patient was positioned supine on the [employer] table.  Once the patient was safely positioned with adequate padding of boney prominences we predraped out the hip, and used fluoroscopy to confirm orientation of the pelvis.      The right hip was then prepped and draped from proximal iliac crest to   mid thigh with a shower curtain technique.      Time-out was performed identifying the patient, planned procedure, and the appropriate extremity.     An incision was then made 2 cm lateral to the   anterior superior iliac spine extending over the orientation of the   tensor fascia lata muscle and sharp dissection was carried down to the   fascia of the muscle.      The fascia was then incised.  The muscle belly was identified and swept   laterally and retractor placed along the superior neck.  Following   cauterization of the circumflex vessels and removing some  pericapsular   fat, a second cobra retractor was placed on the inferior neck.  A T-capsulotomy was made along the line of the   superior neck to the trochanteric fossa, then extended proximally and   distally.  Tag sutures were placed and the retractors were then placed   intracapsular.  We then identified the trochanteric fossa and   orientation of my neck cut and then made a neck osteotomy with the femur on traction.  The femoral   head was removed without difficulty or complication.  Traction was let   off and retractors were placed posterior and anterior around the   acetabulum.      The labrum and foveal tissue were debrided.  I began reaming with a 46 mm   reamer and reamed up to 55 mm reamer with good bony bed preparation and a 56 mm  cup was chosen.  The final 56 mm Pinnacle cup was then impacted under fluoroscopy to confirm the depth of penetration and orientation with respect to   Abduction and forward flexion.  A screw was placed into the ilium followed by the hole eliminator.  The final    36+4 neutral Altrex liner was impacted with good visualized rim fit.  The cup was positioned anatomically within the acetabular portion of the pelvis.      At this point, the femur was rolled to 100 degrees.  Further capsule was   released off the inferior aspect of the femoral neck.  I then   released the superior capsule proximally.  With the leg in a neutral position the hook was placed laterally   along the femur under the vastus lateralis origin and elevated manually and then held in position using the hook attachment on the bed.  The leg was then extended and adducted with the leg rolled to 100   degrees of external rotation.  Retractors were placed along the medial calcar and posteriorly over the greater trochanter.  Once the proximal femur was fully   exposed, I used a box osteotome to set orientation.  I then began   broaching with the starting chili pepper broach and passed this by hand and then broached up to 7.  With the 7 broach in place I chose a high offset neck and did several trial reductions.  The offset was appropriate, leg lengths   appeared to be equal best matched with the +1.5 head ball trial confirmed radiographically.  This combination provided the best sense of stability while best matching his other hip replacement.  Given these findings, I went ahead and dislocated the hip, repositioned all   retractors and positioned the right hip in the extended and abducted position.  The final 7 Hi Tri Lock stem was   chosen and it was impacted down to the level of neck cut.  Based on this   and the trial reductions, a final 36+1.5 delta ceramic ball was chosen and   impacted onto a clean and dry trunnion, and the hip was reduced.  The   hip had been irrigated throughout the case again at this point.  I did   reapproximate the superior capsular leaflet to the anterior leaflet   using #1 Vicryl.  The fascia of the   tensor fascia lata muscle was then reapproximated using #1 Vicryl  and #0 Stratafix sutures.  The   remaining wound was closed with 2-0 Vicryl and running 4-0 Monocryl.   The hip was cleaned, dried, and dressed sterilely  using Dermabond and   Aquacel dressing.  The patient was then brought   to recovery room in stable condition tolerating the procedure well.    Lanney Gins, PA-C was present for the entirety of the case involved from   preoperative positioning, perioperative retractor management, general   facilitation of the case, as well as primary wound closure as assistant.            Madlyn Frankel Charlann Boxer, M.D.        02/23/2018 9:05 AM

## 2018-02-23 NOTE — Transfer of Care (Signed)
Immediate Anesthesia Transfer of Care Note  Patient: James Mendez  Procedure(s) Performed: RIGHT TOTAL HIP ARTHROPLASTY ANTERIOR APPROACH (Right Hip)  Patient Location: PACU  Anesthesia Type:MAC and Spinal  Level of Consciousness: awake, alert  and oriented  Airway & Oxygen Therapy: Patient Spontanous Breathing and Patient connected to face mask oxygen  Post-op Assessment: Report given to RN and Post -op Vital signs reviewed and stable  Post vital signs: Reviewed and stable  Last Vitals:  Vitals Value Taken Time  BP 108/79 02/23/2018  9:30 AM  Temp    Pulse 61 02/23/2018  9:31 AM  Resp 15 02/23/2018  9:31 AM  SpO2 100 % 02/23/2018  9:31 AM  Vitals shown include unvalidated device data.  Last Pain:  Vitals:   02/23/18 0610  TempSrc:   PainSc: 9       Patients Stated Pain Goal: 5 (97/98/92 1194)  Complications: No apparent anesthesia complications

## 2018-02-24 ENCOUNTER — Encounter (HOSPITAL_COMMUNITY): Payer: Self-pay | Admitting: Orthopedic Surgery

## 2018-02-24 DIAGNOSIS — E663 Overweight: Secondary | ICD-10-CM | POA: Diagnosis present

## 2018-02-24 LAB — CBC
HCT: 35.3 % — ABNORMAL LOW (ref 39.0–52.0)
Hemoglobin: 11.1 g/dL — ABNORMAL LOW (ref 13.0–17.0)
MCH: 29.8 pg (ref 26.0–34.0)
MCHC: 31.4 g/dL (ref 30.0–36.0)
MCV: 94.6 fL (ref 78.0–100.0)
PLATELETS: 279 10*3/uL (ref 150–400)
RBC: 3.73 MIL/uL — ABNORMAL LOW (ref 4.22–5.81)
RDW: 13.9 % (ref 11.5–15.5)
WBC: 9.9 10*3/uL (ref 4.0–10.5)

## 2018-02-24 LAB — BASIC METABOLIC PANEL
Anion gap: 7 (ref 5–15)
BUN: 11 mg/dL (ref 8–23)
CALCIUM: 8.4 mg/dL — AB (ref 8.9–10.3)
CO2: 28 mmol/L (ref 22–32)
CREATININE: 0.82 mg/dL (ref 0.61–1.24)
Chloride: 106 mmol/L (ref 98–111)
GFR calc Af Amer: >60 mL/min (ref >60)
GLUCOSE: 136 mg/dL — AB (ref 70–99)
Potassium: 4.7 mmol/L (ref 3.5–5.1)
SODIUM: 141 mmol/L (ref 135–145)

## 2018-02-24 MED ORDER — DOCUSATE SODIUM 100 MG PO CAPS: 100.0000 mg | ORAL_CAPSULE | Freq: Two times a day (BID) | ORAL | 0 refills | Status: DC

## 2018-02-24 MED ORDER — HYDROCODONE-ACETAMINOPHEN 7.5-325 MG PO TABS: 1.0000 | ORAL_TABLET | ORAL | 0 refills | Status: DC | PRN

## 2018-02-24 MED ORDER — ASPIRIN 81 MG PO CHEW: 81.0000 mg | CHEWABLE_TABLET | Freq: Two times a day (BID) | ORAL | 0 refills | Status: AC

## 2018-02-24 MED ORDER — FERROUS SULFATE 325 (65 FE) MG PO TABS: 325.0000 mg | ORAL_TABLET | Freq: Three times a day (TID) | ORAL | 3 refills | Status: DC

## 2018-02-24 MED ORDER — OXYCODONE HCL 5 MG PO TABS
5.0000 mg | ORAL_TABLET | ORAL | Status: DC | PRN
  Administered 2018-02-24: 10 mg via ORAL
  Filled 2018-02-24: qty 2

## 2018-02-24 MED ORDER — ACETAMINOPHEN 500 MG PO TABS: 1000.0000 mg | ORAL_TABLET | Freq: Three times a day (TID) | ORAL | 0 refills | Status: AC

## 2018-02-24 MED ORDER — METHOCARBAMOL 500 MG PO TABS: 500.0000 mg | ORAL_TABLET | Freq: Four times a day (QID) | ORAL | 0 refills | Status: DC | PRN

## 2018-02-24 MED ORDER — OXYCODONE HCL 5 MG PO TABS: 5.0000 mg | ORAL_TABLET | ORAL | 0 refills | Status: DC | PRN

## 2018-02-24 MED ORDER — POLYETHYLENE GLYCOL 3350 17 G PO PACK: 17.0000 g | PACK | Freq: Two times a day (BID) | ORAL | 0 refills | Status: DC

## 2018-02-24 NOTE — Plan of Care (Signed)
Patient is to discharge home. Charge nurse and Nursing clinical instructor/nursing students are to go over patients instructions. Students will remove Iv. Rx to be given. Patient is in stable condition and ready to go home.

## 2018-02-24 NOTE — Progress Notes (Signed)
     Subjective: 1 Day Post-Op Procedure(s) (LRB): RIGHT TOTAL HIP ARTHROPLASTY ANTERIOR APPROACH (Right)   Patient reports pain as severe, rating it as a 8 or 9 out of 10.  Discussed changing his analgesic medication for a short time.  He remembers that we changed it after his last surgery, and that he stepped down quickly after that surgery.  No other events throughout the night. Feels that he worked well with PT yesterday.  Ready to be discharged home if he does well with PT and pain is controlled.     Objective:   VITALS:   Vitals:   02/24/18 0058 02/24/18 0554  BP: (!) 160/86 125/81  Pulse: 85 69  Resp: 16 17  Temp: 97.7 F (36.5 C) 97.8 F (36.6 C)  SpO2: 99% 96%    Dorsiflexion/Plantar flexion intact Incision: dressing C/D/I No cellulitis present Compartment soft  LABS Recent Labs    02/24/18 0527  HGB 11.1*  HCT 35.3*  WBC 9.9  PLT 279    Recent Labs    02/24/18 0527  NA 141  K 4.7  BUN 11  CREATININE 0.82  GLUCOSE 136*     Assessment/Plan: 1 Day Post-Op Procedure(s) (LRB): RIGHT TOTAL HIP ARTHROPLASTY ANTERIOR APPROACH (Right) Foley cath d/c'ed Advance diet Up with therapy D/C IV fluids Discharge home Follow up in 2 weeks at [facility]). Follow up with OLIN,Darien Mignogna D in 2 weeks.  Contact information:  [facility]) [address], [address] [place] [address] [phone]    Overweight (BMI 25-29.9) Estimated body mass index is 29.31 kg/m as calculated from the following:   Height as of this encounter: 5\' 7"  (1.702 m).   Weight as of this encounter: 84.9 kg. Patient also counseled that weight may inhibit the healing process Patient counseled that losing weight will help with future health issues      Anastasio Auerbach. Tanzie Rothschild   PAC  02/24/2018, 8:23 AM

## 2018-02-24 NOTE — Discharge Summary (Signed)
Physician Discharge Summary  Patient ID: James Mendez MRN: [id number] DOB/AGE: May 14, 1956 62 y.o.  Admit date: 02/23/2018 Discharge date: 02/24/2018   Procedures:  Procedure(s) (LRB): RIGHT TOTAL HIP ARTHROPLASTY ANTERIOR APPROACH (Right)  Attending Physician:  Dr. Durene Romans   Admission Diagnoses:   Right hip primary OA / pain  Discharge Diagnoses:  Principal Problem:   S/P right THA, AA Active Problems:   Overweight (BMI 25.0-29.9)  Past Medical History:  Diagnosis Date  . BPH (benign prostatic hyperplasia)   . GERD (gastroesophageal reflux disease)   . Hypertension   . OA (osteoarthritis)    HIPS , KNEES    HPI:    James Mendez, 62 y.o. male, has a history of pain and functional disability in the right hip(s) due to arthritis and patient has failed non-surgical conservative treatments for greater than 12 weeks to include NSAID's and/or analgesics, corticosteriod injections, use of assistive devices and activity modification.  Onset of symptoms was gradual starting  years ago with gradually worsening course since that time.The patient noted prior procedures of the hip to include arthroplasty on the left hip on Nov 23, 2017 per Dr. Charlann Boxer.  Patient currently rates pain in the right hip at 10 out of 10 with activity. Patient has night pain, worsening of pain with activity and weight bearing, trendelenberg gait, pain that interfers with activities of daily living and pain with passive range of motion. Patient has evidence of periarticular osteophytes and joint space narrowing by imaging studies. This condition presents safety issues increasing the risk of falls.  There is no current active infection.  Risks, benefits and expectations were discussed with the patient.  Risks including but not limited to the risk of anesthesia, blood clots, nerve damage, blood vessel damage, failure of the prosthesis, infection and up to and including death.  Patient understand the risks, benefits and  expectations and wishes to proceed with surgery.   PCP: System, Pcp Not In   Discharged Condition: good  Hospital Course:  Patient underwent the above stated procedure on 02/23/2018. Patient tolerated the procedure well and brought to the recovery room in good condition and subsequently to the floor.  POD #1 BP: 125/81 ; Pulse: 69 ; Temp: 97.8 F (36.6 C) ; Resp: 17 Patient reports pain as severe, rating it as a 8 or 9 out of 10.  Discussed changing his analgesic medication for a short time.  He remembers that we changed it after his last surgery, and that he stepped down quickly after that surgery.  No other events throughout the night. Feels that he worked well with PT yesterday.  Ready to be discharged home. Dorsiflexion/plantar flexion intact, incision: dressing C/D/I, no cellulitis present and compartment soft.   LABS  Basename    HGB     11.1  HCT     35.3    Discharge Exam: General appearance: alert, cooperative and no distress Extremities: Homans sign is negative, no sign of DVT, no edema, redness or tenderness in the calves or thighs and no ulcers, gangrene or trophic changes  Disposition:  Home with follow up in 2 weeks   Follow-up Information    Durene Romans, MD. Schedule an appointment as soon as possible for a visit in 2 weeks.   Specialty:  Orthopedic Surgery Contact information: [address] [place] [address] [place] [address] [phone]           Discharge Instructions    Call MD / Call 911   Complete  by:  As directed    If you experience chest pain or shortness of breath, CALL 911 and be transported to the hospital emergency room.  If you develope a fever above 101 F, pus (white drainage) or increased drainage or redness at the wound, or calf pain, call your surgeon's office.   Change dressing   Complete by:  As directed    Maintain surgical dressing until follow up in the clinic. If the edges start to pull up, may reinforce with tape. If the dressing  is no longer working, may remove and cover with gauze and tape, but must keep the area dry and clean.  Call with any questions or concerns.   Constipation Prevention   Complete by:  As directed    Drink plenty of fluids.  Prune juice may be helpful.  You may use a stool softener, such as Colace (over the counter) 100 mg twice a day.  Use MiraLax (over the counter) for constipation as needed.   Diet - low sodium heart healthy   Complete by:  As directed    Discharge instructions   Complete by:  As directed    Maintain surgical dressing until follow up in the clinic. If the edges start to pull up, may reinforce with tape. If the dressing is no longer working, may remove and cover with gauze and tape, but must keep the area dry and clean.  Follow up in 2 weeks at [facility]. Call with any questions or concerns.   Increase activity slowly as tolerated   Complete by:  As directed    Weight bearing as tolerated with assist device (walker, cane, etc) as directed, use it as long as suggested by your surgeon or therapist, typically at least 4-6 weeks.   TED hose   Complete by:  As directed    Use stockings (TED hose) for 2 weeks on both leg(s).  You may remove them at night for sleeping.      Allergies as of 02/24/2018      Reactions   Adhesive [tape] Rash   Itching      Medication List    STOP taking these medications   traMADol 50 MG tablet Commonly known as:  ULTRAM     TAKE these medications   acetaminophen 500 MG tablet Commonly known as:  TYLENOL Take 2 tablets (1,000 mg total) by mouth every 8 (eight) hours.   amLODipine 5 MG tablet Commonly known as:  NORVASC Take 5 mg by mouth every morning.   aspirin 81 MG chewable tablet Chew 1 tablet (81 mg total) by mouth 2 (two) times daily. Take for 4 weeks, then resume regular dose. Start taking on:  02/25/2018   docusate sodium 100 MG capsule Commonly known as:  COLACE Take 1 capsule (100 mg total) by mouth 2 (two) times  daily.   ferrous sulfate 325 (65 FE) MG tablet Take 1 tablet (325 mg total) by mouth 3 (three) times daily with meals.   finasteride 5 MG tablet Commonly known as:  PROSCAR Take 5 mg by mouth daily.   gabapentin 300 MG capsule Commonly known as:  NEURONTIN Take 300 mg by mouth 3 (three) times daily.   Lidocaine 5 % Crea Apply 1 application topically daily as needed (pain).   losartan 50 MG tablet Commonly known as:  COZAAR Take 50 mg by mouth every morning.   methocarbamol 500 MG tablet Commonly known as:  ROBAXIN Take 1 tablet (500 mg total) by mouth  every 6 (six) hours as needed for muscle spasms.   metoprolol tartrate 25 MG tablet Commonly known as:  LOPRESSOR Take 25 mg by mouth 2 (two) times daily.   oxyCODONE 5 MG immediate release tablet Commonly known as:  Oxy IR/ROXICODONE Take 1-2 tablets (5-10 mg total) by mouth every 4 (four) hours as needed for moderate pain or severe pain (1 pill for scale 1-5; 2 pills for scale 6-10).   pantoprazole 40 MG tablet Commonly known as:  PROTONIX Take 40 mg by mouth every morning.   polyethylene glycol packet Commonly known as:  MIRALAX / GLYCOLAX Take 17 g by mouth 2 (two) times daily.   tamsulosin 0.4 MG Caps capsule Commonly known as:  FLOMAX Take 0.4 mg by mouth at bedtime.            Discharge Care Instructions  (From admission, onward)         Start     Ordered   02/24/18 0000  Change dressing    Comments:  Maintain surgical dressing until follow up in the clinic. If the edges start to pull up, may reinforce with tape. If the dressing is no longer working, may remove and cover with gauze and tape, but must keep the area dry and clean.  Call with any questions or concerns.   02/24/18 5625           Signed: Anastasio Auerbach. Javoni Lucken   PA-C  02/24/2018, 5:21 PM

## 2018-02-24 NOTE — Progress Notes (Signed)
Physical Therapy Treatment Patient Details Name: James Mendez MRN: [id number] DOB: July 27, 1955 Today's Date: 02/24/2018    History of Present Illness Pt is a 62 YO male s/p R DA-THA on 02/23/18. PMH includes L DA-THA, BPH, GERD, HTN, OA. Pt with 2 falls, one resulting in pelvic fracture and the other post L THA.     PT Comments    Pt ambulated in hallway and practiced one step.  Pt also performed LE exercises and provided with HEP handout.  Pt reports being familiar with exercises since L THA in June.  Pt had no further questions and feels ready for d/c home today.    Follow Up Recommendations  Follow surgeon's recommendation for DC plan and follow-up therapies;No PT follow up     Equipment Recommendations  None recommended by PT    Recommendations for Other Services       Precautions / Restrictions Precautions Precautions: Fall Restrictions Weight Bearing Restrictions: No Other Position/Activity Restrictions: WBAT     Mobility  Bed Mobility Overal bed mobility: Modified Independent             General bed mobility comments: pt self assisted R LE over EOB  Transfers Overall transfer level: Needs assistance Equipment used: Rolling walker (2 wheeled) Transfers: Sit to/from Stand Sit to Stand: Min guard;Supervision         General transfer comment: verbal cues for hand placement  Ambulation/Gait Ambulation/Gait assistance: Min guard Gait Distance (Feet): 200 Feet Assistive device: Rolling walker (2 wheeled) Gait Pattern/deviations: Decreased stride length;Decreased stance time - right;Antalgic;Trunk flexed;Step-through pattern     General Gait Details: verbal cues for posture and RW positioning   Stairs Stairs: Yes Stairs assistance: Min guard Stair Management: Step to pattern;Forwards;With walker Number of Stairs: 1 General stair comments: verbal cues for sequence and safety, pt performed twice, reports understanding   Wheelchair Mobility     Modified Rankin (Stroke Patients Only)       Balance                                            Cognition Arousal/Alertness: Awake/alert Behavior During Therapy: WFL for tasks assessed/performed Overall Cognitive Status: Within Functional Limits for tasks assessed                                        Exercises Total Joint Exercises Ankle Circles/Pumps: AROM;20 reps;Seated;Both Hip ABduction/ADduction: AROM;10 reps;Standing;Right(standing exercises performed with UE support) Knee Flexion: AROM;10 reps;Right;Standing Marching in Standing: AROM;10 reps;Right;Standing Standing Hip Extension: AROM;10 reps;Right;Standing    General Comments        Pertinent Vitals/Pain Pain Assessment: 0-10 Pain Score: 8  Pain Location: R thigh Pain Descriptors / Indicators: Discomfort;Sore Pain Intervention(s): Limited activity within patient's tolerance;Repositioned;Monitored during session    Home Living                      Prior Function            PT Goals (current goals can now be found in the care plan section) Progress towards PT goals: Progressing toward goals    Frequency    7X/week      PT Plan Current plan remains appropriate    Co-evaluation  AM-PAC PT "6 Clicks" Daily Activity  Outcome Measure  Difficulty turning over in bed (including adjusting bedclothes, sheets and blankets)?: A Little Difficulty moving from lying on back to sitting on the side of the bed? : A Little Difficulty sitting down on and standing up from a chair with arms (e.g., wheelchair, bedside commode, etc,.)?: A Little Help needed moving to and from a bed to chair (including a wheelchair)?: A Little Help needed walking in hospital room?: A Little Help needed climbing 3-5 steps with a railing? : A Little 6 Click Score: 18    End of Session   Activity Tolerance: Patient tolerated treatment well;No increased pain Patient  left: in chair;with chair alarm set;with call bell/phone within reach Nurse Communication: Mobility status PT Visit Diagnosis: Other abnormalities of gait and mobility (R26.89)     Time: 6568-1275 PT Time Calculation (min) (ACUTE ONLY): 18 min  Charges:  $Therapeutic Exercise: 8-22 mins                     Zenovia Jarred, PT, DPT 02/24/2018 Pager: [phone]  Maida Sale E 02/24/2018, 10:15 AM

## 2018-09-23 ENCOUNTER — Ambulatory Visit: Admit: 2018-09-23 | Payer: Medicare PPO | Admitting: Orthopedic Surgery

## 2018-09-23 SURGERY — ARTHROPLASTY, KNEE, TOTAL
Anesthesia: Spinal | Laterality: Right

## 2019-12-24 ENCOUNTER — Emergency Department (HOSPITAL_COMMUNITY)
Admission: EM | Admit: 2019-12-24 | Discharge: 2019-12-24 | Disposition: A | Discharge status: Home / Self Care | Payer: Medicare PPO | Attending: Emergency Medicine | Admitting: Emergency Medicine

## 2019-12-24 ENCOUNTER — Encounter (HOSPITAL_COMMUNITY): Payer: Self-pay | Admitting: Emergency Medicine

## 2019-12-24 ENCOUNTER — Emergency Department (HOSPITAL_COMMUNITY): Payer: Medicare PPO

## 2019-12-24 ENCOUNTER — Other Ambulatory Visit: Payer: Self-pay

## 2019-12-24 DIAGNOSIS — R55 Syncope and collapse: Secondary | ICD-10-CM | POA: Diagnosis not present

## 2019-12-24 DIAGNOSIS — J449 Chronic obstructive pulmonary disease, unspecified: Secondary | ICD-10-CM | POA: Diagnosis not present

## 2019-12-24 DIAGNOSIS — S52502A Unspecified fracture of the lower end of left radius, initial encounter for closed fracture: Secondary | ICD-10-CM | POA: Diagnosis not present

## 2019-12-24 DIAGNOSIS — M791 Myalgia, unspecified site: Secondary | ICD-10-CM | POA: Insufficient documentation

## 2019-12-24 DIAGNOSIS — Z96641 Presence of right artificial hip joint: Secondary | ICD-10-CM | POA: Diagnosis not present

## 2019-12-24 DIAGNOSIS — Y9289 Other specified places as the place of occurrence of the external cause: Secondary | ICD-10-CM | POA: Diagnosis not present

## 2019-12-24 DIAGNOSIS — R42 Dizziness and giddiness: Secondary | ICD-10-CM | POA: Insufficient documentation

## 2019-12-24 DIAGNOSIS — Z96642 Presence of left artificial hip joint: Secondary | ICD-10-CM | POA: Diagnosis not present

## 2019-12-24 DIAGNOSIS — R0602 Shortness of breath: Secondary | ICD-10-CM | POA: Diagnosis not present

## 2019-12-24 DIAGNOSIS — Y9389 Activity, other specified: Secondary | ICD-10-CM | POA: Insufficient documentation

## 2019-12-24 DIAGNOSIS — F17210 Nicotine dependence, cigarettes, uncomplicated: Secondary | ICD-10-CM | POA: Diagnosis not present

## 2019-12-24 DIAGNOSIS — I10 Essential (primary) hypertension: Secondary | ICD-10-CM | POA: Diagnosis not present

## 2019-12-24 DIAGNOSIS — W19XXXA Unspecified fall, initial encounter: Secondary | ICD-10-CM | POA: Insufficient documentation

## 2019-12-24 DIAGNOSIS — Y999 Unspecified external cause status: Secondary | ICD-10-CM | POA: Diagnosis not present

## 2019-12-24 DIAGNOSIS — S6992XA Unspecified injury of left wrist, hand and finger(s), initial encounter: Secondary | ICD-10-CM | POA: Diagnosis present

## 2019-12-24 LAB — CBC WITH DIFFERENTIAL/PLATELET
Abs Immature Granulocytes: 0.05 10*3/uL (ref 0.00–0.07)
Basophils Absolute: 0.1 10*3/uL (ref 0.0–0.1)
Basophils Relative: 1 %
Eosinophils Absolute: 0.3 10*3/uL (ref 0.0–0.5)
Eosinophils Relative: 4 %
HCT: 39.6 % (ref 39.0–52.0)
Hemoglobin: 13.3 g/dL (ref 13.0–17.0)
Immature Granulocytes: 1 %
Lymphocytes Relative: 16 %
Lymphs Abs: 1.3 10*3/uL (ref 0.7–4.0)
MCH: 34.2 pg — ABNORMAL HIGH (ref 26.0–34.0)
MCHC: 33.6 g/dL (ref 30.0–36.0)
MCV: 101.8 fL — ABNORMAL HIGH (ref 80.0–100.0)
Monocytes Absolute: 0.7 10*3/uL (ref 0.1–1.0)
Monocytes Relative: 9 %
Neutro Abs: 5.8 10*3/uL (ref 1.7–7.7)
Neutrophils Relative %: 69 %
Platelets: 215 10*3/uL (ref 150–400)
RBC: 3.89 MIL/uL — ABNORMAL LOW (ref 4.22–5.81)
RDW: 12.4 % (ref 11.5–15.5)
WBC: 8.3 10*3/uL (ref 4.0–10.5)
nRBC: 0 % (ref 0.0–0.2)

## 2019-12-24 LAB — BASIC METABOLIC PANEL
Anion gap: 11 (ref 5–15)
BUN: 6 mg/dL — ABNORMAL LOW (ref 8–23)
CO2: 24 mmol/L (ref 22–32)
Calcium: 8.8 mg/dL — ABNORMAL LOW (ref 8.9–10.3)
Chloride: 94 mmol/L — ABNORMAL LOW (ref 98–111)
Creatinine, Ser: 0.59 mg/dL — ABNORMAL LOW (ref 0.61–1.24)
GFR calc Af Amer: >60 mL/min (ref >60)
GFR calc non Af Amer: >60 mL/min (ref >60)
Glucose, Bld: 102 mg/dL — ABNORMAL HIGH (ref 70–99)
Potassium: 3.9 mmol/L (ref 3.5–5.1)
Sodium: 129 mmol/L — ABNORMAL LOW (ref 135–145)

## 2019-12-24 LAB — TROPONIN I (HIGH SENSITIVITY)
Troponin I (High Sensitivity): 8 ng/L (ref <18)
Troponin I (High Sensitivity): 9 ng/L (ref <18)

## 2019-12-24 MED ORDER — IBUPROFEN 400 MG PO TABS
600.0000 mg | ORAL_TABLET | Freq: Once | ORAL | Status: AC
  Administered 2019-12-24: 600 mg via ORAL
  Filled 2019-12-24: qty 2

## 2019-12-24 MED ORDER — OXYCODONE-ACETAMINOPHEN 5-325 MG PO TABS: 1.0000 | ORAL_TABLET | Freq: Four times a day (QID) | ORAL | 0 refills | Status: AC | PRN

## 2019-12-24 MED ORDER — OXYCODONE-ACETAMINOPHEN 5-325 MG PO TABS
1.0000 | ORAL_TABLET | Freq: Once | ORAL | Status: AC
  Administered 2019-12-24: 1 via ORAL
  Filled 2019-12-24: qty 1

## 2019-12-24 MED ORDER — SODIUM CHLORIDE 0.9 % IV BOLUS
1000.0000 mL | Freq: Once | INTRAVENOUS | Status: AC
  Administered 2019-12-24: 1000 mL via INTRAVENOUS

## 2019-12-24 NOTE — ED Notes (Signed)
Pt states left hand feels numb. Left wrist is swollen.

## 2019-12-24 NOTE — ED Triage Notes (Addendum)
Pt states he got dizziness, lost consciousness and fell yesterday and injuring his left wrist.

## 2019-12-24 NOTE — ED Provider Notes (Signed)
[facility] EMERGENCY DEPARTMENT Provider Note   CSN: [id number] Arrival date & time: 12/24/19  1529     History Chief Complaint  Patient presents with  . Arm Pain    James Mendez is a 64 y.o. male with a history of COPD (continues to smoke 1 pack/day, not on home oxygen), hypertension, obesity, osteoarthritis of the knees, presented emerge department syncope and left wrist pain.  Patient reports that yesterday when he was going to pick up pizza for dinner, he had a coughing or choking fit, thereafter lost consciousness.  He noted that he was feeling extremely lightheaded during this.  He was ambulating at the time.  He believes he broke his fall with his left hand while falling down.  He is not sure how long he was unconscious.  He presents to the ED today complaining of left wrist pain and swelling.  He denies any active lightheadedness, chest pain, shortness of breath.  Further history is provided by his daughter at the bedside.  She tells me that the patient has had about 7 episodes of syncope over the past several years.  She thinks they are increasing in frequency.  She denies any history of seizures.  She has had some of these prior incidents per witness, and that he loses consciousness for about 3 to 5 minutes, before reviving.  It appears that each of these episodes is preceded by a prodrome of lightheadedness, without any chest pain or shortness of breath.  They are all occurring during walking or ambulation.  None of them occur while sitting or completely at rest.  They are unsure whether each of his episodes have been associated with coughing or choking fits (related to his wheezing/COPD and humid weather).  She reports that the patient went to the [facility] emergency department for his last episode.  She said he had testing done and was told "everything was normal" in the ER.  The patient's PCP took him off of one of his blood pressure medications thinking this may be contributing to  his syncope.  He continues with two of his blood pressure pills (metoprolol 25 mg BID and norvasc 5 mg once daily).  He has been referred to see a cardiologist in the past but has never seen a cardiologist.  He denies any known history of MI or cardiac disease or arrhythmias.  He has no history of seizures.  HPI     Past Medical History:  Diagnosis Date  . BPH (benign prostatic hyperplasia)   . GERD (gastroesophageal reflux disease)   . Hypertension   . OA (osteoarthritis)    HIPS , KNEES    Patient Active Problem List   Diagnosis Date Noted  . Overweight (BMI 25.0-29.9) 02/24/2018  . 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 Obese 11/25/2017  . S/P left THA, AA 11/24/2017    Past Surgical History:  Procedure Laterality Date  . CATARACT EXTRACTION W/ INTRAOCULAR LENS  IMPLANT, BILATERAL  2013  . TOTAL HIP ARTHROPLASTY Left 11/24/2017   Procedure: LEFT TOTAL HIP ARTHROPLASTY ANTERIOR APPROACH;  Surgeon: Durene Romans, MD;  Location: [facility];  Service: Orthopedics;  Laterality: Left;  70 mins  . TOTAL HIP ARTHROPLASTY Right 02/23/2018   Procedure: RIGHT TOTAL HIP ARTHROPLASTY ANTERIOR APPROACH;  Surgeon: Durene Romans, MD;  Location: [facility];  Service: Orthopedics;  Laterality: Right;  70 mins       History reviewed. No pertinent family history.  Social History   Tobacco Use  . Smoking  status: Current Every Day Smoker    Packs/day: 1.00    Years: 40.00    Pack years: 40.00    Types: Cigarettes  . Smokeless tobacco: Never Used  Vaping Use  . Vaping Use: Never used  Substance Use Topics  . Alcohol use: Yes    Alcohol/week: 6.0 standard drinks    Types: 6 Cans of beer per week    Comment: daily   . Drug use: Never    Home Medications Prior to Admission medications   Medication Sig Start Date End Date Taking? Authorizing Provider  acetaminophen (TYLENOL) 500 MG tablet Take 2 tablets (1,000 mg total) by mouth every 8 (eight) hours. 02/24/18  Yes Babish, Molli HazardMatthew, PA-C  BEVESPI  AEROSPHERE 9-4.8 MCG/ACT AERO Inhale 1 puff into the lungs daily. 10/31/19  Yes [provider]  DULoxetine (CYMBALTA) 30 MG capsule Take 30 mg by mouth daily. 11/18/19  Yes [provider]  FERRETTS 325 (106 Fe) MG TABS tablet Take 1 tablet by mouth daily. 11/07/19  Yes [provider]  finasteride (PROSCAR) 5 MG tablet Take 5 mg by mouth daily.   Yes [provider]  gabapentin (NEURONTIN) 600 MG tablet Take 600 mg by mouth 3 (three) times daily. 10/31/19  Yes [provider]  hydrochlorothiazide (HYDRODIURIL) 12.5 MG tablet Take 12.5 mg by mouth daily. 11/22/19  Yes [provider]  lidocaine (LIDODERM) 5 % Place 1 patch onto the skin daily. 11/22/19  Yes [provider]  losartan (COZAAR) 50 MG tablet Take 50 mg by mouth every morning.    Yes [provider]  metoprolol tartrate (LOPRESSOR) 25 MG tablet Take 25 mg by mouth 2 (two) times daily.   Yes [provider]  pantoprazole (PROTONIX) 40 MG tablet Take 40 mg by mouth every morning.    Yes [provider]  tamsulosin (FLOMAX) 0.4 MG CAPS capsule Take 0.4 mg by mouth at bedtime.   Yes [provider]  oxyCODONE-acetaminophen (PERCOCET/ROXICET) 5-325 MG tablet Take 1 tablet by mouth every 6 (six) hours as needed for up to 3 days for severe pain. 12/24/19 12/27/19  Terald Sleeperrifan, Tahmir Kleckner J, MD    Allergies    Adhesive [tape]  Review of Systems   Review of Systems  Constitutional: Negative for chills and fever.  HENT: Negative for ear pain and sore throat.   Eyes: Negative for pain and visual disturbance.  Respiratory: Positive for shortness of breath. Negative for cough.   Cardiovascular: Negative for chest pain and palpitations.  Gastrointestinal: Negative for abdominal pain, nausea and vomiting.  Genitourinary: Negative for dysuria and hematuria.  Musculoskeletal: Positive for arthralgias and myalgias.  Skin: Negative for color change and rash.    Neurological: Positive for syncope and light-headedness. Negative for seizures, speech difficulty, weakness, numbness and headaches.  Psychiatric/Behavioral: Negative for agitation and confusion.  All other systems reviewed and are negative.   Physical Exam Updated Vital Signs BP (!) 176/71   Pulse 72   Temp 98.3 F (36.8 C) (Oral)   Resp (!) 24   Ht 5\' 7"  (1.702 m)   Wt 90.7 kg   SpO2 91%   BMI 31.32 kg/m   Physical Exam Vitals and nursing note reviewed.  Constitutional:      Appearance: He is well-developed.  HENT:     Head: Normocephalic and atraumatic.  Eyes:     Conjunctiva/sclera: Conjunctivae normal.  Cardiovascular:     Rate and Rhythm: Normal rate and regular rhythm.  Pulses: Normal pulses.  Pulmonary:     Effort: Pulmonary effort is normal. No respiratory distress.     Breath sounds: Normal breath sounds.     Comments: 98% on room air Abdominal:     Palpations: Abdomen is soft.     Tenderness: There is no abdominal tenderness.  Musculoskeletal:     Cervical back: Neck supple.     Comments: Swelling, tenderness of the left wrist No ttp of the elbow or remaining extremities  Skin:    General: Skin is warm and dry.  Neurological:     General: No focal deficit present.     Mental Status: He is alert and oriented to person, place, and time.     Cranial Nerves: No cranial nerve deficit.     Sensory: No sensory deficit.     Motor: No weakness.  Psychiatric:        Mood and Affect: Mood normal.        Behavior: Behavior normal.     ED Results / Procedures / Treatments   Labs (all labs ordered are listed, but only abnormal results are displayed) Labs Reviewed  BASIC METABOLIC PANEL - Abnormal; Notable for the following components:      Result Value   Sodium 129 (*)    Chloride 94 (*)    Glucose, Bld 102 (*)    BUN 6 (*)    Creatinine, Ser 0.59 (*)    Calcium 8.8 (*)    All other components within normal limits  CBC WITH DIFFERENTIAL/PLATELET -  Abnormal; Notable for the following components:   RBC 3.89 (*)    MCV 101.8 (*)    MCH 34.2 (*)    All other components within normal limits  TROPONIN I (HIGH SENSITIVITY)  TROPONIN I (HIGH SENSITIVITY)    EKG EKG Interpretation  Date/Time:  Saturday December 24 2019 16:44:58 EDT Ventricular Rate:  74 PR Interval:    QRS Duration: 85 QT Interval:  406 QTC Calculation: 451 R Axis:   -34 Text Interpretation: Sinus rhythm Left axis deviation No STEMI Confirmed by Alvester Chou [phone]) on 12/24/2019 5:03:33 PM   Radiology DG Chest 2 View  Result Date: 12/24/2019 CLINICAL DATA:  Syncope, possible ascending aortic aneurysm EXAM: CHEST - 2 VIEW COMPARISON:  None. FINDINGS: The heart size and mediastinal contours are within normal limits. Both lungs are clear. Multiple fractures of the right posterolateral ribs, of uncertain acuity although generally nonacute appearing. Additional chronic fracture deformity of the mid right clavicle. IMPRESSION: 1. No acute abnormality of the lungs. 2. No radiographic abnormality of the mediastinum to suggest aortic aneurysm. Consider CT angiogram to further evaluate if there is clinical suspicion for acute aortic pathology. 3. Multiple fractures of the right posterolateral ribs, of uncertain acuity although generally nonacute appearing. Correlate for acute pain. Electronically Signed   By: Lauralyn Primes M.D.   On: 12/24/2019 17:21   DG Wrist Complete Left  Result Date: 12/24/2019 CLINICAL DATA:  Syncope evaluation.  LEFT wrist pain after fall EXAM: LEFT WRIST - COMPLETE 3+ VIEW COMPARISON:  None. FINDINGS: Signs of impacted dorsally angulated distal radius fracture with intra-articular extension both into the radiocarpal joint and the distal radioulnar joint. Mild to moderate dorsal angulation and impaction Osteopenia.  Degenerative changes about the wrist. No additional fractures. IMPRESSION: Signs of impacted dorsally angulated distal radius fracture with  intra-articular extension both into the radiocarpal and distal radioulnar joints. Electronically Signed   By: Donzetta Kohut M.D.   On:  12/24/2019 17:26    Procedures Procedures (including critical care time)  Medications Ordered in ED Medications  ibuprofen (ADVIL) tablet 600 mg (600 mg Oral Given 12/24/19 1642)  oxyCODONE-acetaminophen (PERCOCET/ROXICET) 5-325 MG per tablet 1 tablet (1 tablet Oral Given 12/24/19 1825)  sodium chloride 0.9 % bolus 1,000 mL (0 mLs Intravenous Stopped 12/24/19 1957)    ED Course  I have reviewed the triage vital signs and the nursing notes.  Pertinent labs & imaging results that were available during my care of the patient were reviewed by me and considered in my medical decision making (see chart for details).  64 year old male presented to emergency department with left wrist pain after an episode of syncope that occurred yesterday.  Appears he had a FOOSH injury.  Plan for xrays of the wrist, as he may have a fracture here  No evidence of head trauma or injury to the rest of the body.  He is not on blood thinners.  Regarding his syncope, we'll obtain orthostatic vital signs, ECG, troponin, hgb level, and BMP for hydration status.  DG chest as well.   These may be vasovagal episodes if they are all related to coughing/choking fits, with his COPD flare up in humid weather outside.  However I cannot exclude possible cardiogenic causes such as arrhythmia.  He mentions the possibility of an ascending aortic aneurysm diagnosed years ago in another hospital, but cannot recall the details.  Care everywhere record review from 2018 shows CT chest abdomen pelvis after traumatic injury with multiple rib fractures, but no aneurysm noted on the vasculature at that time in the chest or abdomen (05/27/2017 [facility] [place]).  He has never seen a cardiologist before.  If his workup is benign, and he remains asymptomatic in the ED, I will refer him  again with some urgency to cardiology.    Clinical Course as of Dec 24 2310  Sat Dec 24, 2019  1738 Orthostatic VS unremarkable.  Xray appears to have distal radial fracture per my interpretation, awaiting full report.   [MT]  1746 IMPRESSION: Signs of impacted dorsally angulated distal radius fracture with intra-articular extension both into the radiocarpal and distal radioulnar joints.    [MT]  1841 Will give percocet for persisent pain, 1 liter IVF for mild hyponatremia and hypochloremia.   [MT]  2042 I spoke to Dr Izora Ribas from hand surgery who recommended sugar tong splint and have the patient call his office Monday morning for an appointment.  Pt's pain improved, will d/c with pain medications short term script, f/u with ortho.  I also strongly encouraged him to see a cardiologist for his syncopal episodes, as I think he would benefit from an echocardiogram, and he states he will call his preferred cardiologist (his wife's) for an appointment.  Discharge home with his daughter, who lives with him.   [MT]    Clinical Course User Index [MT] Terald Sleeper, MD    Final Clinical Impression(s) / ED Diagnoses Final diagnoses:  Syncope, unspecified syncope type  Closed fracture of distal end of left radius, unspecified fracture morphology, initial encounter    Rx / DC Orders ED Discharge Orders         Ordered    oxyCODONE-acetaminophen (PERCOCET/ROXICET) 5-325 MG tablet  Every 6 hours PRN     Discontinue  Reprint     12/24/19 2047           Terald Sleeper, MD 12/24/19 2312

## 2019-12-24 NOTE — Discharge Instructions (Signed)
Keep your cast dry at all times.  Do not lift or use your left hand for anything.  Call Dr Debby Bud office Monday at 9 am to ask for a rapid appointment.  Tell them that you were seen in the ER, and that Dr. Izora Ribas wanted you to call in for an appointment this week.  You should call a cardiologist on Monday to ask for an appointment for your episodes of blacking out.  This may be due to a vagal nerve problem, like we discussed.  However you need to rule out the possibility of a heart condition as well.  Please call your preferred heart doctor on Monday.

## 2019-12-24 NOTE — ED Notes (Signed)
Pt. With Xray.

## 2021-12-06 IMAGING — DX DG CHEST 2V
2 series · 3 of 3 positions shown · non-contrast
Comparison: None.

CLINICAL DATA: Syncope, possible ascending aortic aneurysm

EXAM:
CHEST - 2 VIEW

[Series 2: chest lat · 0.14mm/px · 2 of 2 slices shown]
[im 1/2]
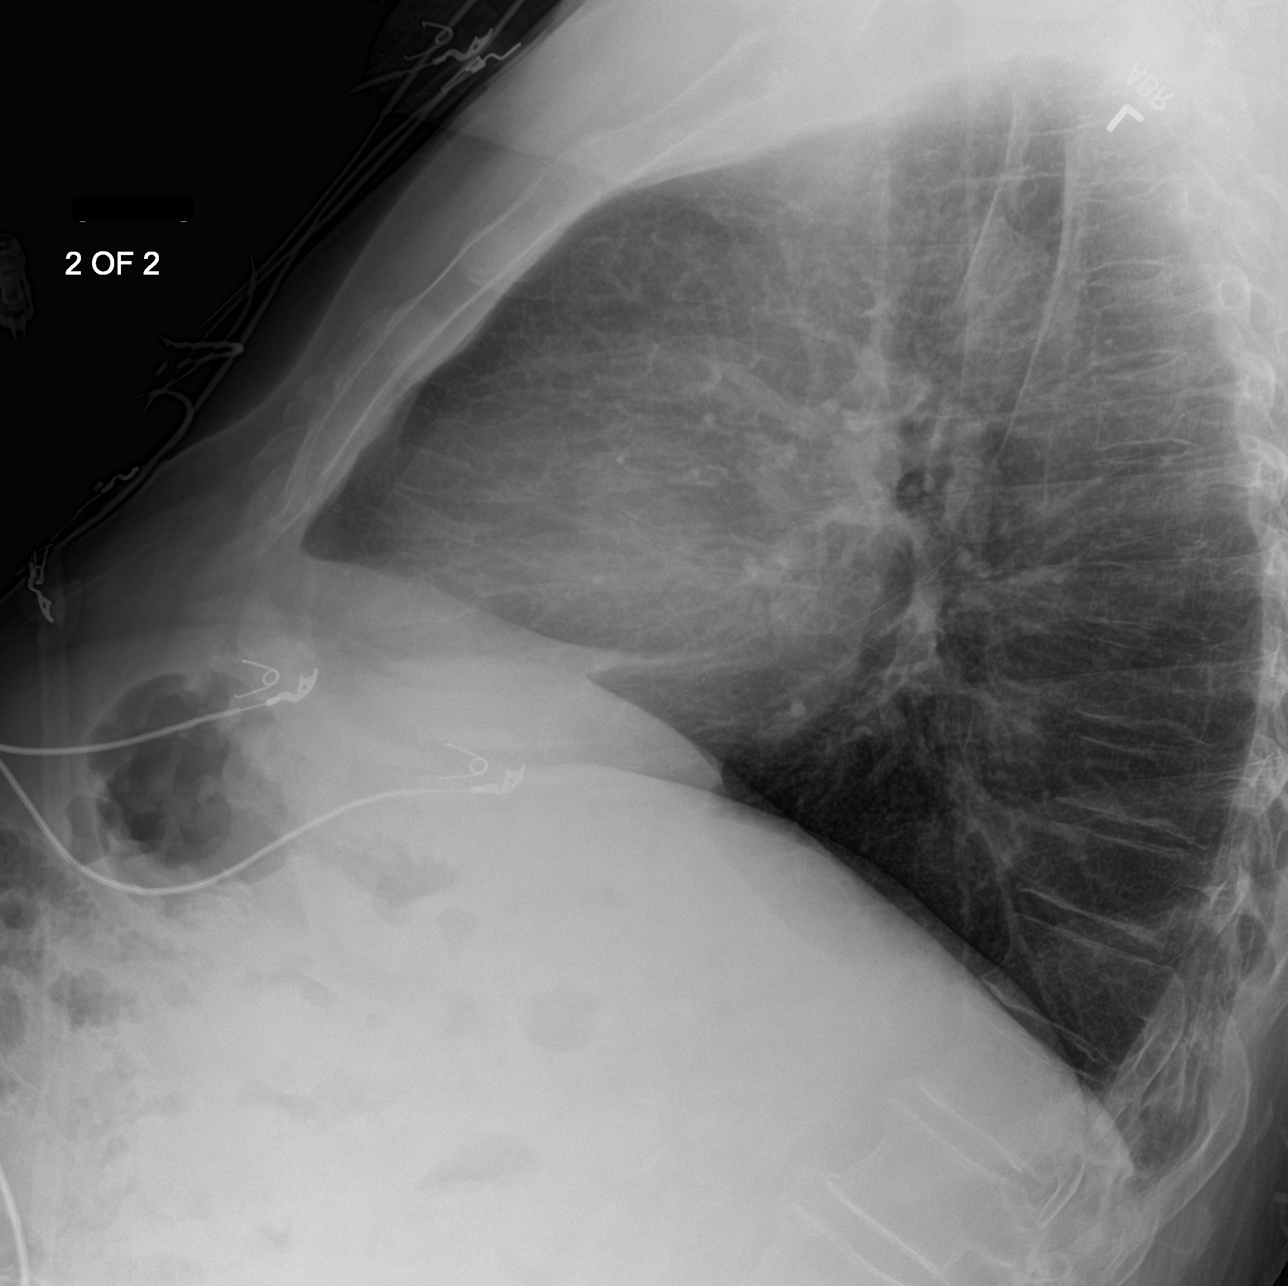
[im 2/2]
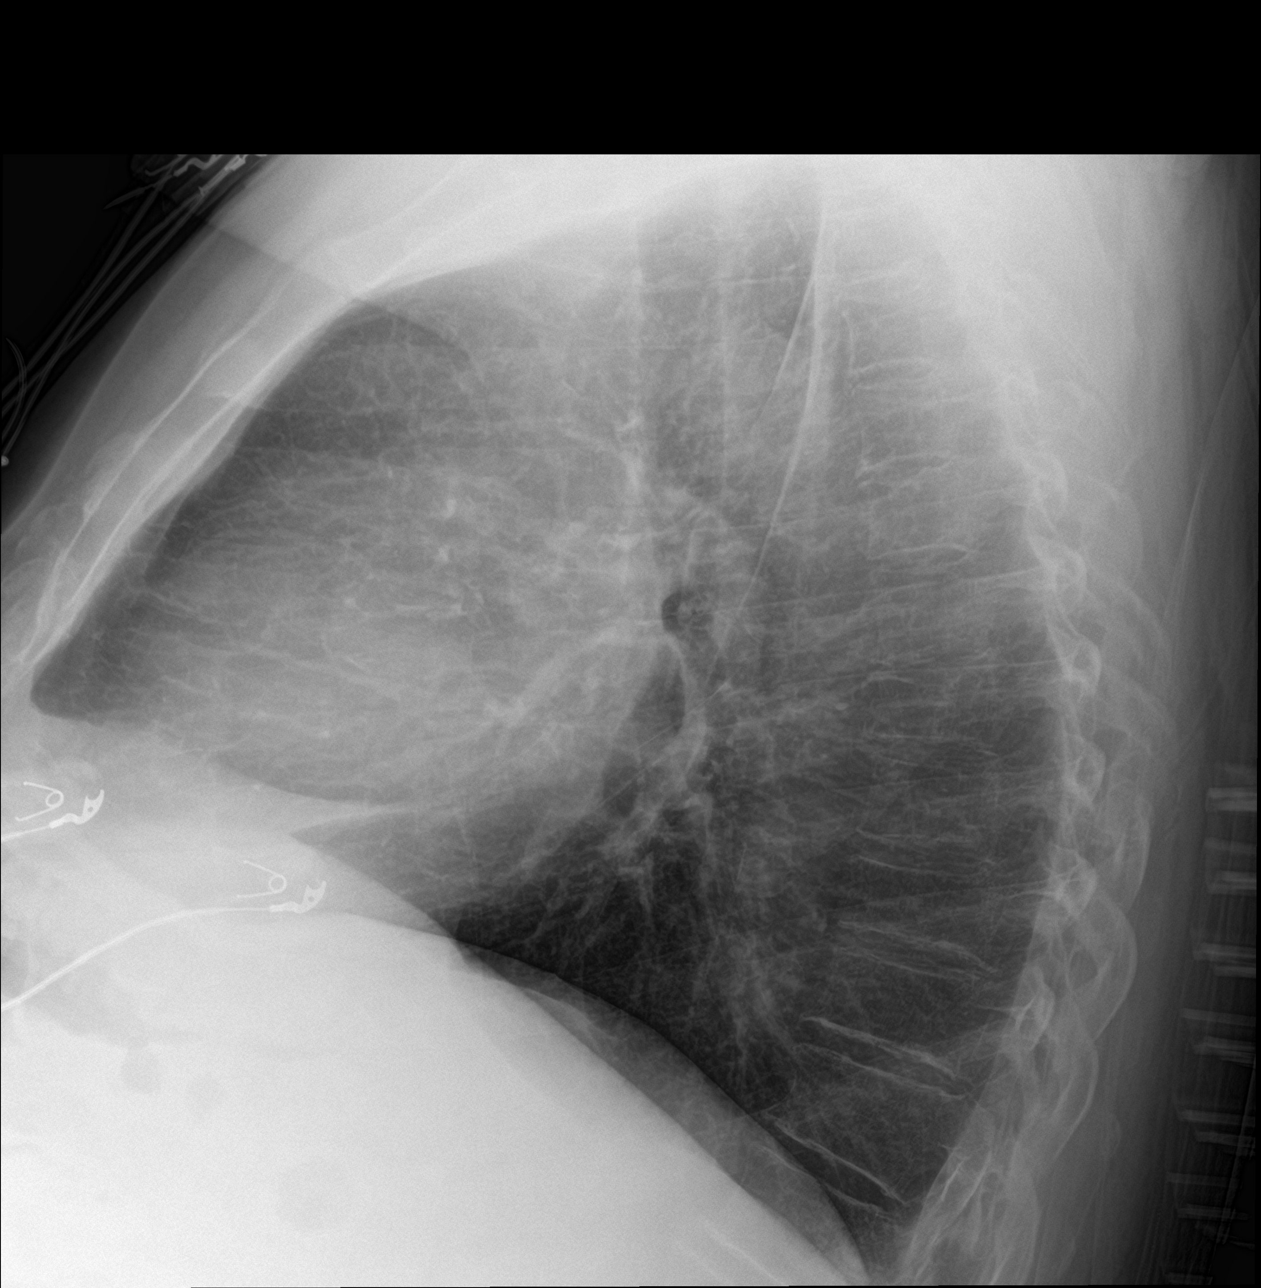

[chest ap]
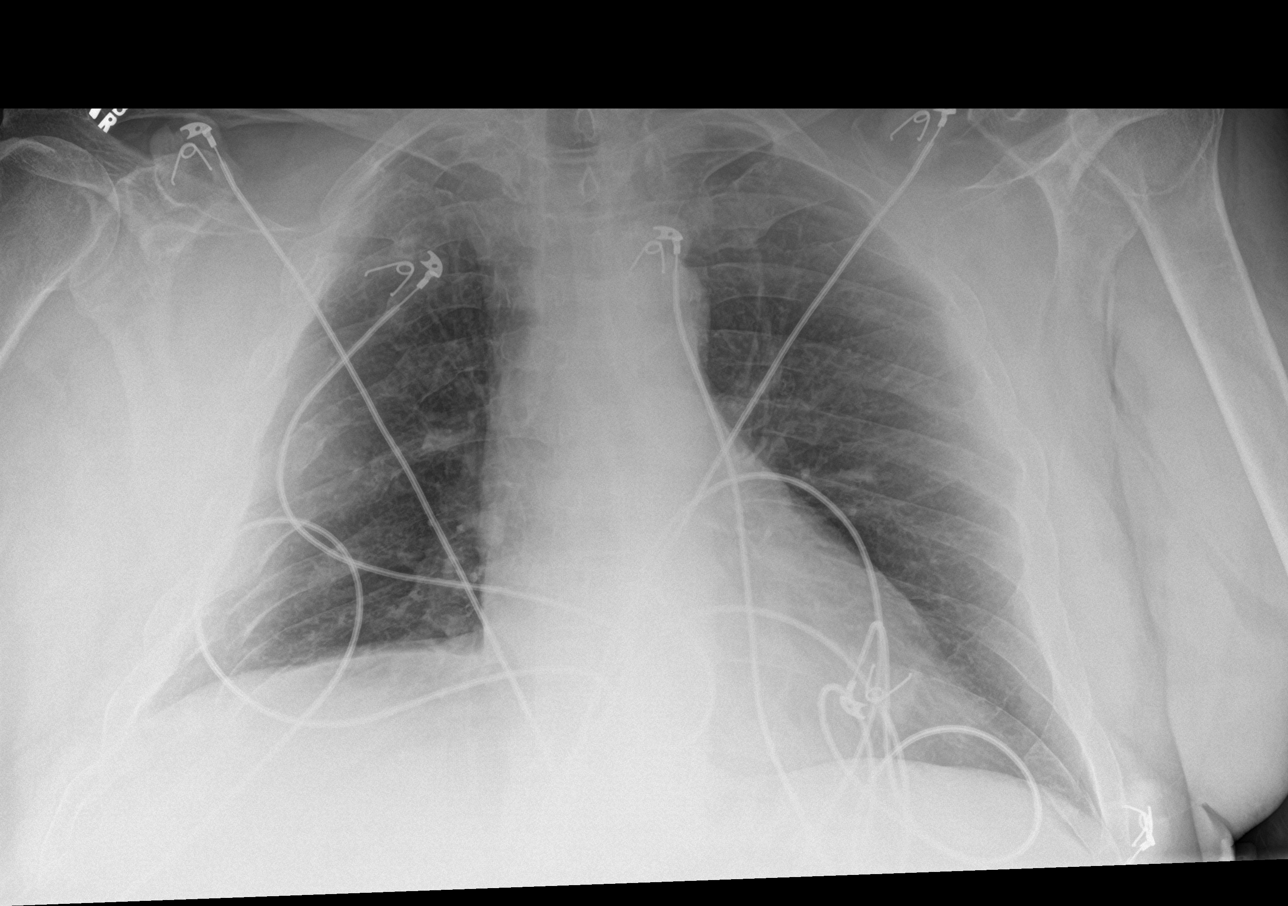

[3 of 3 positions shown; findings below may reference images not displayed]

FINDINGS: The heart size and mediastinal contours are within normal limits.
Both lungs are clear. Multiple fractures of the right posterolateral
ribs, of uncertain acuity although generally nonacute appearing.
Additional chronic fracture deformity of the mid right clavicle.
IMPRESSION: 1. No acute abnormality of the lungs.
2. No radiographic abnormality of the mediastinum to suggest aortic
aneurysm. Consider CT angiogram to further evaluate if there is
clinical suspicion for acute aortic pathology.
3. Multiple fractures of the right posterolateral ribs, of uncertain
acuity although generally nonacute appearing. Correlate for acute
pain.

## 2021-12-06 IMAGING — DX DG WRIST COMPLETE 3+V*L*
4 series · 4 of 4 positions shown · non-contrast
Comparison: None.

CLINICAL DATA: Syncope evaluation.  LEFT wrist pain after fall

EXAM:
LEFT WRIST - COMPLETE 3+ VIEW

[wrist pa]
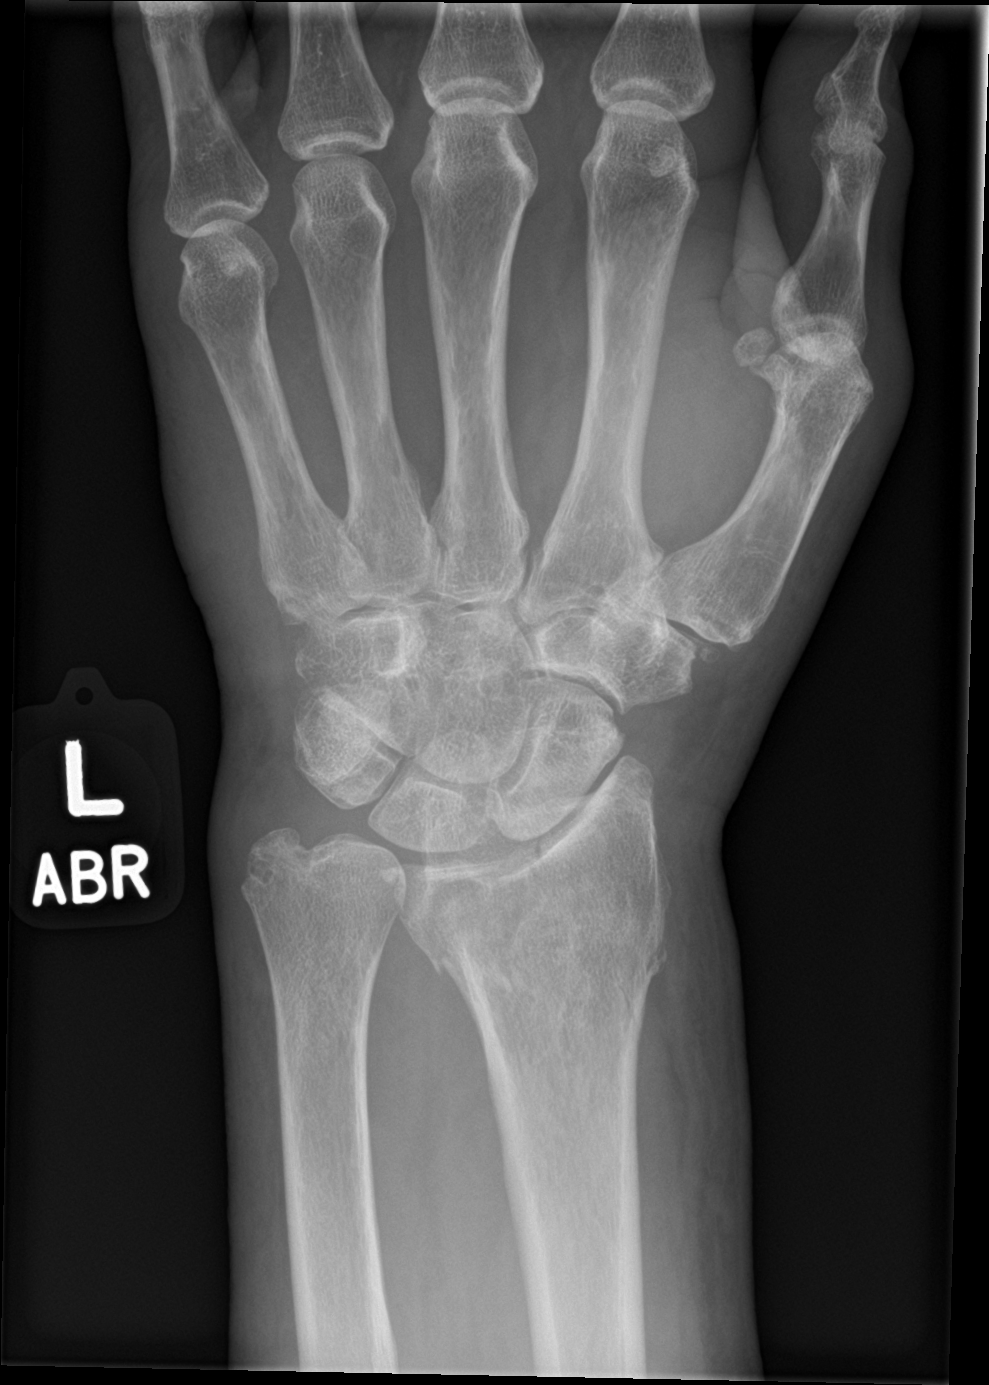

[wrist obl]
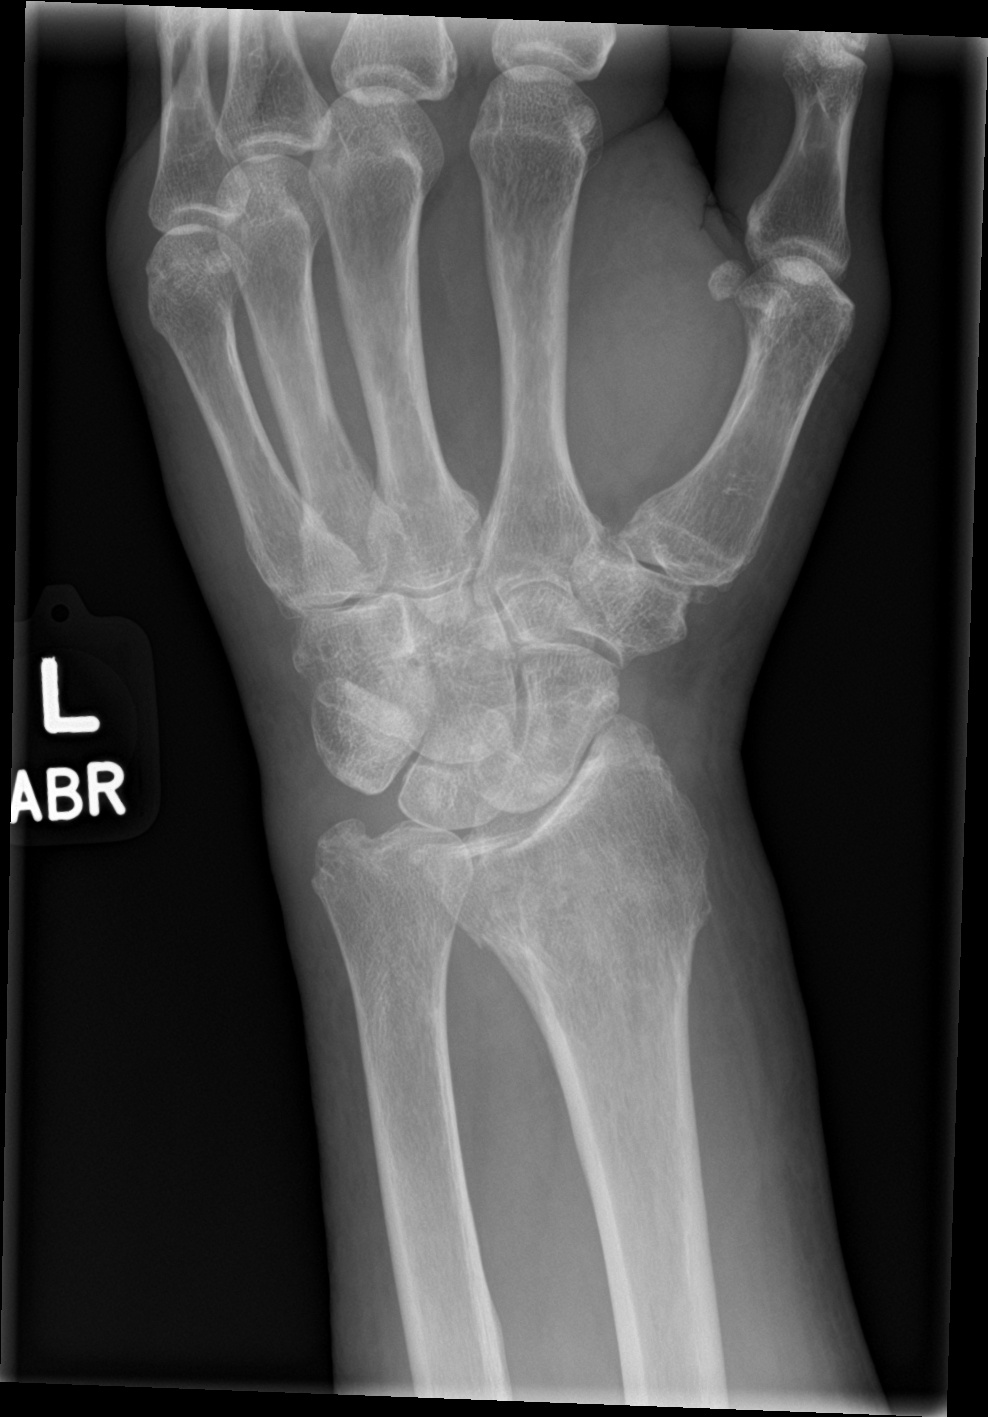

[wrist lat]
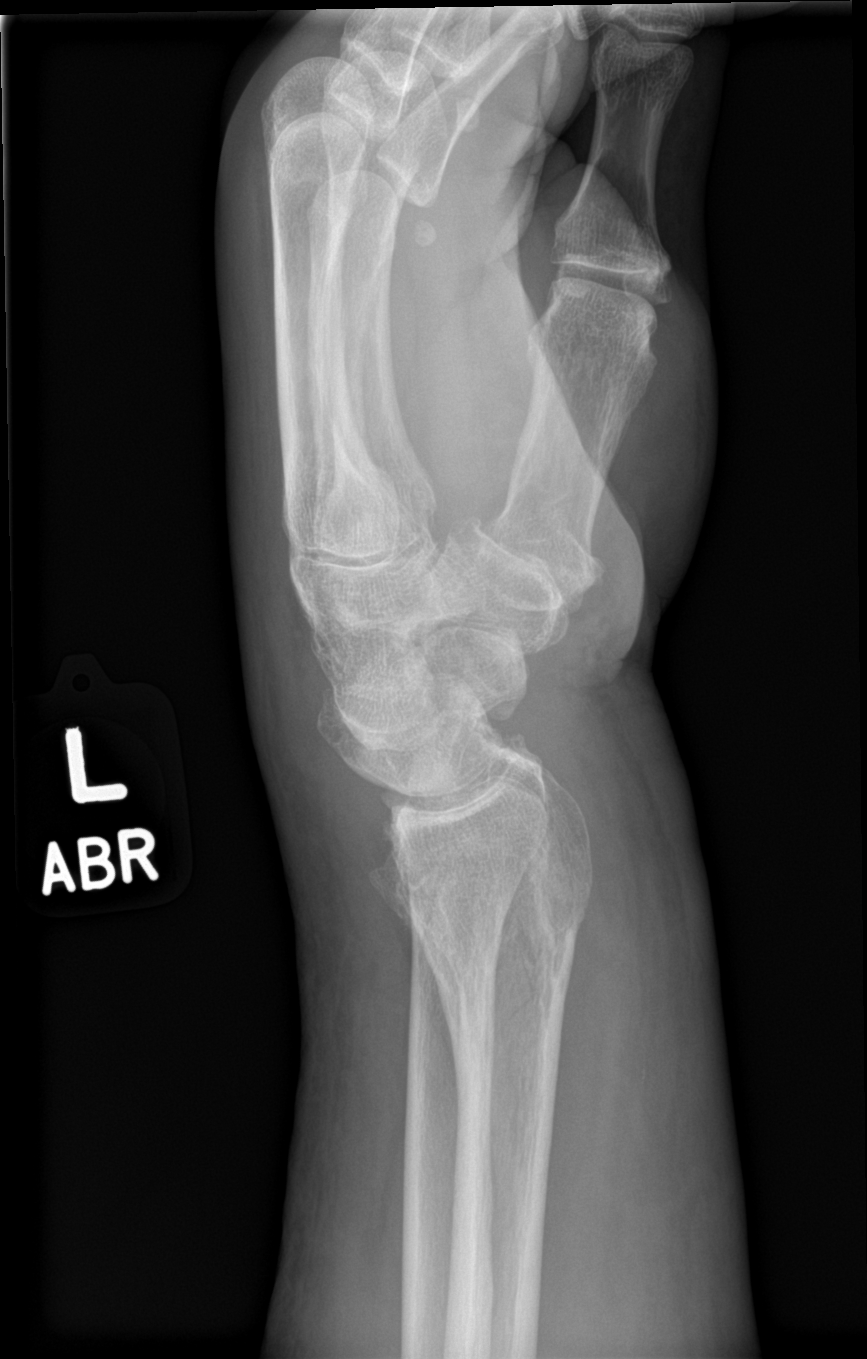

[wrist navicular]
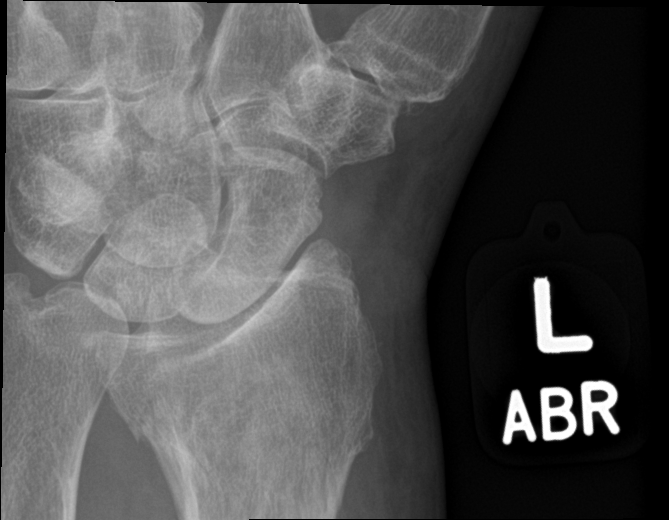

[4 of 4 positions shown; findings below may reference images not displayed]

FINDINGS: Signs of impacted dorsally angulated distal radius fracture with
intra-articular extension both into the radiocarpal joint and the
distal radioulnar joint. Mild to moderate dorsal angulation and
impaction

Osteopenia.  Degenerative changes about the wrist.

No additional fractures.
IMPRESSION: Signs of impacted dorsally angulated distal radius fracture with
intra-articular extension both into the radiocarpal and distal
radioulnar joints.

## 2024-04-23 DEATH — deceased
